# Patient Record
Sex: Female | Born: 1975 | Race: Black or African American | Hispanic: No | Marital: Single | Smoking: Former smoker
Health system: Southern US, Community
[De-identification: ages and names within clinical notes are randomized; demographics above are authoritative.]

## PROBLEM LIST (undated history)

## (undated) DIAGNOSIS — E119 Type 2 diabetes mellitus without complications: Secondary | ICD-10-CM

## (undated) DIAGNOSIS — I10 Essential (primary) hypertension: Secondary | ICD-10-CM

## (undated) HISTORY — PX: BREAST SURGERY: SHX581

---

## 2000-05-25 ENCOUNTER — Emergency Department (HOSPITAL_COMMUNITY): Admission: EM | Admit: 2000-05-25 | Discharge: 2000-05-25 | Payer: Self-pay | Admitting: Emergency Medicine

## 2000-05-25 ENCOUNTER — Encounter: Payer: Self-pay | Admitting: Emergency Medicine

## 2003-04-24 ENCOUNTER — Emergency Department (HOSPITAL_COMMUNITY): Admission: EM | Admit: 2003-04-24 | Discharge: 2003-04-24 | Payer: Self-pay | Admitting: Emergency Medicine

## 2005-07-08 ENCOUNTER — Encounter: Admission: RE | Admit: 2005-07-08 | Discharge: 2005-10-06 | Payer: Self-pay | Admitting: Family Medicine

## 2010-07-09 ENCOUNTER — Emergency Department (HOSPITAL_COMMUNITY): Admission: EM | Admit: 2010-07-09 | Discharge: 2010-07-09 | Payer: Self-pay | Admitting: Emergency Medicine

## 2011-09-12 ENCOUNTER — Other Ambulatory Visit: Payer: Self-pay | Admitting: Family Medicine

## 2011-09-12 ENCOUNTER — Ambulatory Visit
Admission: RE | Admit: 2011-09-12 | Discharge: 2011-09-12 | Disposition: A | Discharge status: Home / Self Care | Payer: 59 | Source: Ambulatory Visit | Attending: Family Medicine | Admitting: Family Medicine

## 2011-09-12 DIAGNOSIS — M545 Low back pain: Secondary | ICD-10-CM

## 2012-11-26 ENCOUNTER — Other Ambulatory Visit: Payer: Self-pay | Admitting: Obstetrics and Gynecology

## 2012-11-26 DIAGNOSIS — N644 Mastodynia: Secondary | ICD-10-CM

## 2012-12-10 ENCOUNTER — Ambulatory Visit
Admission: RE | Admit: 2012-12-10 | Discharge: 2012-12-10 | Disposition: A | Discharge status: Home / Self Care | Payer: Managed Care, Other (non HMO) | Source: Ambulatory Visit | Attending: Obstetrics and Gynecology | Admitting: Obstetrics and Gynecology

## 2012-12-10 ENCOUNTER — Other Ambulatory Visit: Payer: Self-pay | Admitting: Obstetrics and Gynecology

## 2012-12-10 DIAGNOSIS — N644 Mastodynia: Secondary | ICD-10-CM

## 2014-04-20 ENCOUNTER — Emergency Department (INDEPENDENT_AMBULATORY_CARE_PROVIDER_SITE_OTHER)
Admission: EM | Admit: 2014-04-20 | Discharge: 2014-04-20 | Disposition: A | Discharge status: Home / Self Care | Payer: Managed Care, Other (non HMO) | Source: Home / Self Care | Attending: Family Medicine | Admitting: Family Medicine

## 2014-04-20 ENCOUNTER — Encounter (HOSPITAL_COMMUNITY): Payer: Self-pay | Admitting: Emergency Medicine

## 2014-04-20 DIAGNOSIS — Y939 Activity, unspecified: Secondary | ICD-10-CM

## 2014-04-20 DIAGNOSIS — S86819A Strain of other muscle(s) and tendon(s) at lower leg level, unspecified leg, initial encounter: Secondary | ICD-10-CM

## 2014-04-20 DIAGNOSIS — X500XXA Overexertion from strenuous movement or load, initial encounter: Secondary | ICD-10-CM

## 2014-04-20 DIAGNOSIS — S838X9A Sprain of other specified parts of unspecified knee, initial encounter: Secondary | ICD-10-CM

## 2014-04-20 DIAGNOSIS — S86812A Strain of other muscle(s) and tendon(s) at lower leg level, left leg, initial encounter: Secondary | ICD-10-CM

## 2014-04-20 HISTORY — DX: Type 2 diabetes mellitus without complications: E11.9

## 2014-04-20 MED ORDER — DICLOFENAC POTASSIUM 50 MG PO TABS: 50.0000 mg | ORAL_TABLET | Freq: Three times a day (TID) | ORAL | Status: DC

## 2014-04-20 NOTE — ED Notes (Signed)
Pain in left knee since 5-12 when tried to get into car. ?twisted knee

## 2014-04-20 NOTE — Discharge Instructions (Signed)
Ice, ace and medicine as needed, activity as tolerated, let pain be your guide, see orthopedist if further problems

## 2014-04-20 NOTE — ED Provider Notes (Signed)
CSN: [id number]     Arrival date & time 04/20/14  1824 History   First MD Initiated Contact with Patient 04/20/14 1843     Chief Complaint  Patient presents with  . Knee Pain   (Consider location/radiation/quality/duration/timing/severity/associated sxs/prior Treatment) Patient is a 38 y.o. female presenting with knee pain. The history is provided by the patient.  Knee Pain Location:  Knee Time since incident:  2 days Injury: no (onset getting into car and twisted knee with subsequent pain with stairs and getting up from seated position.)   Knee location:  L knee Pain details:    Quality:  Aching   Radiates to:  Does not radiate   Progression:  Unchanged Chronicity:  New Dislocation: no   Prior injury to area:  No   Past Medical History  Diagnosis Date  . Diabetes mellitus without complication    Past Surgical History  Procedure Laterality Date  . Cesarean section    . Breast surgery     History reviewed. No pertinent family history. History  Substance Use Topics  . Smoking status: Never Smoker   . Smokeless tobacco: Not on file  . Alcohol Use: Yes   OB History   Grav Para Term Preterm Abortions TAB SAB Ect Mult Living                 Review of Systems  Constitutional: Negative.   Musculoskeletal: Negative for joint swelling.  Skin: Negative.     Allergies  Review of patient's allergies indicates no known allergies.  Home Medications   Prior to Admission medications   Medication Sig Start Date End Date Taking? Authorizing Provider  insulin detemir (LEVEMIR) 100 UNIT/ML injection Inject 15 Units into the skin at bedtime.   Yes Historical Provider, MD  lisinopril (PRINIVIL,ZESTRIL) 5 MG tablet Take 5 mg by mouth daily.   Yes Historical Provider, MD   BP 171/78  Pulse 85  Temp(Src) 98.1 F (36.7 C) (Oral)  Resp 16  SpO2 97% Physical Exam  Nursing note and vitals reviewed. Constitutional: She is oriented to person, place, and time. She appears  well-developed and well-nourished.  Musculoskeletal: She exhibits tenderness.       Left knee: She exhibits abnormal patellar mobility. She exhibits normal range of motion, no swelling, no effusion, normal alignment, no LCL laxity, normal meniscus and no MCL laxity. Tenderness found. Patellar tendon tenderness noted. No medial joint line and no lateral joint line tenderness noted.       Legs: Neurological: She is alert and oriented to person, place, and time.  Skin: Skin is warm and dry.    ED Course  Procedures (including critical care time) Labs Review Labs Reviewed - No data to display  Imaging Review No results found.   MDM   1. Strain of left patellar tendon        Billy Fischer, MD 04/20/14 [phone]

## 2015-03-19 ENCOUNTER — Encounter: Payer: Self-pay | Admitting: Podiatry

## 2015-03-19 ENCOUNTER — Ambulatory Visit (INDEPENDENT_AMBULATORY_CARE_PROVIDER_SITE_OTHER): Payer: Managed Care, Other (non HMO) | Admitting: Podiatry

## 2015-03-19 VITALS — BP 155/92 | HR 80 | Resp 12

## 2015-03-19 DIAGNOSIS — E119 Type 2 diabetes mellitus without complications: Secondary | ICD-10-CM

## 2015-03-19 DIAGNOSIS — B351 Tinea unguium: Secondary | ICD-10-CM

## 2015-03-19 NOTE — Patient Instructions (Signed)
Diabetes and Foot Care Diabetes may cause you to have problems because of poor blood supply (circulation) to your feet and legs. This may cause the skin on your feet to become thinner, break easier, and heal more slowly. Your skin may become dry, and the skin may peel and crack. You may also have nerve damage in your legs and feet causing decreased feeling in them. You may not notice minor injuries to your feet that could lead to infections or more serious problems. Taking care of your feet is one of the most important things you can do for yourself.  HOME CARE INSTRUCTIONS  Wear shoes at all times, even in the house. Do not go barefoot. Bare feet are easily injured.  Check your feet daily for blisters, cuts, and redness. If you cannot see the bottom of your feet, use a mirror or ask someone for help.  Wash your feet with warm water (do not use hot water) and mild soap. Then pat your feet and the areas between your toes until they are completely dry. Do not soak your feet as this can dry your skin.  Apply a moisturizing lotion or petroleum jelly (that does not contain alcohol and is unscented) to the skin on your feet and to dry, brittle toenails. Do not apply lotion between your toes.  Trim your toenails straight across. Do not dig under them or around the cuticle. File the edges of your nails with an emery board or nail file.  Do not cut corns or calluses or try to remove them with medicine.  Wear clean socks or stockings every day. Make sure they are not too tight. Do not wear knee-high stockings since they may decrease blood flow to your legs.  Wear shoes that fit properly and have enough cushioning. To break in new shoes, wear them for just a few hours a day. This prevents you from injuring your feet. Always look in your shoes before you put them on to be sure there are no objects inside.  Do not cross your legs. This may decrease the blood flow to your feet.  If you find a minor scrape,  cut, or break in the skin on your feet, keep it and the skin around it clean and dry. These areas may be cleansed with mild soap and water. Do not cleanse the area with peroxide, alcohol, or iodine.  When you remove an adhesive bandage, be sure not to damage the skin around it.  If you have a wound, look at it several times a day to make sure it is healing.  Do not use heating pads or hot water bottles. They may burn your skin. If you have lost feeling in your feet or legs, you may not know it is happening until it is too late.  Make sure your health care provider performs a complete foot exam at least annually or more often if you have foot problems. Report any cuts, sores, or bruises to your health care provider immediately. SEEK MEDICAL CARE IF:   You have an injury that is not healing.  You have cuts or breaks in the skin.  You have an ingrown nail.  You notice redness on your legs or feet.  You feel burning or tingling in your legs or feet.  You have pain or cramps in your legs and feet.  Your legs or feet are numb.  Your feet always feel cold. SEEK IMMEDIATE MEDICAL CARE IF:   There is increasing redness,   swelling, or pain in or around a wound.  There is a red line that goes up your leg.  Pus is coming from a wound.  You develop a fever or as directed by your health care provider.  You notice a bad smell coming from an ulcer or wound. Document Released: 11/21/2000 Document Revised: 07/27/2013 Document Reviewed: 05/03/2013 ExitCare Patient Information 2015 [employer]. This information is not intended to replace advice given to you by your health care provider. Make sure you discuss any questions you have with your health care provider.  

## 2015-03-19 NOTE — Progress Notes (Signed)
   Subjective:    Patient ID: Tara Livingston, female    DOB: 07/03/1976, 39 y.o.   MRN: [id number]  HPI  N-THICK NAIL, SORE L-B/L TOENAILS D-1 YEAR O-SLOWLY C-THICKER A-PRESSURE T-NONE  Patient has history of responding to terbinafine by mouth in the past  N-SHARP PAIN L-B/L TOP FEET D-5 MONTHS O-SLOWLY C-WORSE A-PRESSURE, STANDING T-SOAK EPSON SALT Patient describes pain only on first step in the morning and pain resolves throughout the day She describes being more active in the gym and attempt to lose weight    Review of Systems  Constitutional: Positive for activity change.  HENT: Positive for sinus pressure.   Neurological: Positive for headaches.  All other systems reviewed and are negative.  Patient denies any history of foot ulceration or claudication    Objective:   Physical Exam  Orientated 3  Vascular: DP pulses 2/4 bilaterally PT pulses 2/4 bilaterally Capillary reflex immediate bilaterally  Neurological: Sensation to 10 g monofilament wire intact 5/5 bilaterally Vibratory sensation intact bilaterally Ankle reflex equal and reactive bilaterally  Dermatological: The hallux toenails are hypertrophic incurvated and discolored and tender to direct palpation The remaining toenails have occasional texture and color changes  Musculoskeletal: No deformities noted bilaterally There is no restriction ankle, subtalar, midtarsal joints bilaterally There are no areas of palpable tenderness in the right and left feet particularly in the dorsal midfoot bilaterally      Assessment & Plan:   Assessment: Satisfactory neurovascular status Mycotic hallux toenails Midfoot pain may be associated with patient's recent increase in physical activity  Plan: Debrided hallux toenails without a bleeding Return patient 3 weeks for debridement and may offer patient culture if she wishes to consider taking terbinafine again  Discussed patient's gym activity and  encouraged her to minimize ballistic intense activity and substitute swimming, rowing machine, elliptical. Discouraged patient from consistent running  Reappoint 3 months

## 2015-07-02 ENCOUNTER — Ambulatory Visit: Payer: Managed Care, Other (non HMO) | Admitting: Podiatry

## 2016-05-17 ENCOUNTER — Ambulatory Visit (HOSPITAL_COMMUNITY)
Admission: EM | Admit: 2016-05-17 | Discharge: 2016-05-17 | Disposition: A | Discharge status: Home / Self Care | Payer: Managed Care, Other (non HMO) | Attending: Emergency Medicine | Admitting: Emergency Medicine

## 2016-05-17 ENCOUNTER — Ambulatory Visit (INDEPENDENT_AMBULATORY_CARE_PROVIDER_SITE_OTHER): Payer: Managed Care, Other (non HMO)

## 2016-05-17 ENCOUNTER — Encounter (HOSPITAL_COMMUNITY): Payer: Self-pay | Admitting: *Deleted

## 2016-05-17 DIAGNOSIS — S63502A Unspecified sprain of left wrist, initial encounter: Secondary | ICD-10-CM

## 2016-05-17 NOTE — ED Provider Notes (Signed)
CSN: [id number]     Arrival date & time 05/17/16  1827 History   First MD Initiated Contact with Patient 05/17/16 1844     Chief Complaint  Patient presents with  . Fall   (Consider location/radiation/quality/duration/timing/severity/associated sxs/prior Treatment) HPI History obtained from patient: Location: Left wrist  Context/Duration: [place] a few hours ago, while playing kickball  Severity: 3-4  Quality:ache Timing:        constant    Home Treatment: none Associated symptoms:  none Family History:DM, HTN       Past Medical History  Diagnosis Date  . Diabetes mellitus without complication [facility])    Past Surgical History  Procedure Laterality Date  . Cesarean section    . Breast surgery     No family history on file. Social History  Substance Use Topics  . Smoking status: Never Smoker   . Smokeless tobacco: None  . Alcohol Use: Yes   OB History    No data available     Review of Systems  Denies: HEADACHE, NAUSEA, ABDOMINAL PAIN, CHEST PAIN, CONGESTION, DYSURIA, SHORTNESS OF BREATH  Allergies  Review of patient's allergies indicates no known allergies.  Home Medications   Prior to Admission medications   Medication Sig Start Date End Date Taking? Authorizing Provider  empagliflozin (JARDIANCE) 25 MG TABS tablet Take 25 mg by mouth daily.   Yes Historical Provider, MD  insulin detemir (LEVEMIR) 100 UNIT/ML injection Inject 17 Units into the skin at bedtime.    Yes Historical Provider, MD  Liraglutide (VICTOZA [place]) Inject into the skin.   Yes Historical Provider, MD  metformin (FORTAMET) 1000 MG (OSM) 24 hr tablet Take 1,000 mg by mouth 2 (two) times daily. 02/10/15  Yes Historical Provider, MD  lisinopril (PRINIVIL,ZESTRIL) 5 MG tablet Take 5 mg by mouth daily.    Historical Provider, MD  topiramate (TOPAMAX) 100 MG tablet Take 100 mg by mouth 2 (two) times daily.    Historical Provider, MD   Meds Ordered and Administered this Visit  Medications - No data to  display  BP 137/82 mmHg  Pulse 99  Temp(Src) 98.2 F (36.8 C) (Oral)  Resp 18  SpO2 100% No data found.   Physical Exam NURSES NOTES AND VITAL SIGNS REVIEWED. CONSTITUTIONAL: Well developed, well nourished, no acute distress HEENT: normocephalic, atraumatic EYES: Conjunctiva normal NECK:normal ROM, supple, no adenopathy PULMONARY:No respiratory distress, normal effort ABDOMINAL: Soft, ND, NT BS+, No CVAT MUSCULOSKELETAL: Normal ROM of all extremities, Left wrist is without swelling, or visible/palpable deformity.  SKIN: warm and dry without rash PSYCHIATRIC: Mood and affect, behavior are normal  ED Course  Procedures (including critical care time)  Labs Review Labs Reviewed - No data to display  Imaging Review Dg Wrist Complete Left  05/17/2016  CLINICAL DATA:  Fall on outstretched hand during kickball with left wrist pain EXAM: LEFT WRIST - COMPLETE 3+ VIEW COMPARISON:  None. FINDINGS: Mild soft tissue swelling throughout the left wrist. No fracture, dislocation, suspicious focal osseous lesion, appreciable arthropathy or radiopaque foreign body. IMPRESSION: Mild left wrist soft tissue swelling, with no fracture or malalignment. Electronically Signed   By: Ilona Sorrel M.D.   On: 05/17/2016 19:25   Reviewed xray and report used to form MDM.   Visual Acuity Review  Right Eye Distance:   Left Eye Distance:   Bilateral Distance:    Right Eye Near:   Left Eye Near:    Bilateral Near:       Wrist splint applied. Expect  full recovery  MDM   1. Wrist sprain, left, initial encounter     Patient is reassured that there are no issues that require transfer to higher level of care at this time or additional tests. Patient is advised to continue home symptomatic treatment. Patient is advised that if there are new or worsening symptoms to attend the emergency department, contact primary care provider, or return to UC. Instructions of care provided discharged home in stable  condition.    THIS NOTE WAS GENERATED USING A VOICE RECOGNITION SOFTWARE PROGRAM. ALL REASONABLE EFFORTS  WERE MADE TO PROOFREAD THIS DOCUMENT FOR ACCURACY.  I have verbally reviewed the discharge instructions with the patient. A printed AVS was given to the patient.  All questions were answered prior to discharge.      Konrad Felix, [place] 05/17/16 [phone]

## 2016-05-17 NOTE — Discharge Instructions (Signed)
Cryotherapy Cryotherapy is when you put ice on your injury. Ice helps lessen pain and puffiness (swelling) after an injury. Ice works the best when you start using it in the first 24 to 48 hours after an injury. HOME CARE  Put a dry or damp towel between the ice pack and your skin.  You may press gently on the ice pack.  Leave the ice on for no more than 10 to 20 minutes at a time.  Check your skin after 5 minutes to make sure your skin is okay.  Rest at least 20 minutes between ice pack uses.  Stop using ice when your skin loses feeling (numbness).  Do not use ice on someone who cannot tell you when it hurts. This includes small children and people with memory problems (dementia). GET HELP RIGHT AWAY IF:  You have white spots on your skin.  Your skin turns blue or pale.  Your skin feels waxy or hard.  Your puffiness gets worse. MAKE SURE YOU:   Understand these instructions.  Will watch your condition.  Will get help right away if you are not doing well or get worse.   This information is not intended to replace advice given to you by your health care provider. Make sure you discuss any questions you have with your health care provider.   Document Released: 05/12/2008 Document Revised: 02/16/2012 Document Reviewed: 07/17/2011 Elsevier Interactive Patient Education 2016 Elsevier Inc. Wrist Pain There are many things that can cause wrist pain. Some common causes include:  An injury to the wrist area, such as a sprain, strain, or fracture.  Overuse of the joint.  A condition that causes increased pressure on a nerve in the wrist (carpal tunnel syndrome).  Wear and tear of the joints that occurs with aging (osteoarthritis).  A variety of other types of arthritis. Sometimes, the cause of wrist pain is not known. The pain often goes away when you follow your health care provider's instructions for relieving pain at home. If your wrist pain continues, tests may need to be  done to diagnose your condition. HOME CARE INSTRUCTIONS Pay attention to any changes in your symptoms. Take these actions to help with your pain:  Rest the wrist area for at least 48 hours or as told by your health care provider.  If directed, apply ice to the injured area:  Put ice in a plastic bag.  Place a towel between your skin and the bag.  Leave the ice on for 20 minutes, 2-3 times per day.  Keep your arm raised (elevated) above the level of your heart while you are sitting or lying down.  If a splint or elastic bandage has been applied, use it as told by your health care provider.  Remove the splint or bandage only as told by your health care provider.  Loosen the splint or bandage if your fingers become numb or have a tingling feeling, or if they turn cold or blue.  Take over-the-counter and prescription medicines only as told by your health care provider.  Keep all follow-up visits as told by your health care provider. This is important. SEEK MEDICAL CARE IF:  Your pain is not helped by treatment.  Your pain gets worse. SEEK IMMEDIATE MEDICAL CARE IF:  Your fingers become swollen.  Your fingers turn white, very red, or cold and blue.  Your fingers are numb or have a tingling feeling.  You have difficulty moving your fingers.   This information is not  intended to replace advice given to you by your health care provider. Make sure you discuss any questions you have with your health care provider.   Document Released: 09/03/2005 Document Revised: 08/15/2015 Document Reviewed: 04/11/2015 Elsevier Interactive Patient Education [employer].

## 2016-05-17 NOTE — ED Notes (Signed)
Reports tripping and falling while playing kickball today.  C/O left wrist pain.  CMS intact.  Also c/o left forearm pain with palpation.

## 2017-03-07 ENCOUNTER — Ambulatory Visit (INDEPENDENT_AMBULATORY_CARE_PROVIDER_SITE_OTHER): Payer: Managed Care, Other (non HMO) | Admitting: Family Medicine

## 2017-03-07 VITALS — BP 141/83 | HR 79 | Temp 98.0°F | Resp 20 | Ht 65.5 in | Wt 311.5 lb

## 2017-03-07 DIAGNOSIS — E118 Type 2 diabetes mellitus with unspecified complications: Secondary | ICD-10-CM

## 2017-03-07 DIAGNOSIS — G43809 Other migraine, not intractable, without status migrainosus: Secondary | ICD-10-CM | POA: Diagnosis not present

## 2017-03-07 DIAGNOSIS — Z794 Long term (current) use of insulin: Secondary | ICD-10-CM

## 2017-03-07 LAB — POCT URINALYSIS DIP (MANUAL ENTRY)
Bilirubin, UA: NEGATIVE
Blood, UA: NEGATIVE
Ketones, POC UA: NEGATIVE
LEUKOCYTES UA: NEGATIVE
Nitrite, UA: NEGATIVE
Protein Ur, POC: NEGATIVE
Spec Grav, UA: 1.015 (ref 1.030–1.035)
UROBILINOGEN UA: 0.2 (ref ?–2.0)
pH, UA: 5 (ref 5.0–8.0)

## 2017-03-07 LAB — POCT GLYCOSYLATED HEMOGLOBIN (HGB A1C): Hemoglobin A1C: 9.2

## 2017-03-07 MED ORDER — ASPIRIN EC 81 MG PO TBEC: 81.0000 mg | DELAYED_RELEASE_TABLET | ORAL | Status: AC

## 2017-03-07 MED ORDER — METFORMIN HCL ER (OSM) 1000 MG PO TB24: 2000.0000 mg | ORAL_TABLET | Freq: Every day | ORAL | 1 refills | Status: DC

## 2017-03-07 MED ORDER — LIRAGLUTIDE 18 MG/3ML ~~LOC~~ SOPN: 1.8000 mg | PEN_INJECTOR | SUBCUTANEOUS | 1 refills | Status: DC

## 2017-03-07 MED ORDER — EMPAGLIFLOZIN 25 MG PO TABS: 25.0000 mg | ORAL_TABLET | Freq: Every day | ORAL | 1 refills | Status: DC

## 2017-03-07 MED ORDER — INSULIN DETEMIR 100 UNIT/ML ~~LOC~~ SOLN: 17.0000 [IU] | Freq: Every day | SUBCUTANEOUS | 1 refills | Status: DC

## 2017-03-07 NOTE — Progress Notes (Signed)
Subjective:  By signing my name below, I, Moises Blood, attest that this documentation has been prepared under the direction and in the presence of Delman Cheadle, MD. Electronically Signed: Moises Blood, [place]. 03/07/2017 , 10:58 AM .  Patient was seen in Room 1 .   Patient ID: Tara Livingston, female    DOB: 01-19-1976, 41 y.o.   MRN: [id number] Chief Complaint  Patient presents with  . Establish Care  . Diabetes  . Medication Refill   HPI Tara Livingston is a 41 y.o. female who has history of diabetes presents to Primary Care at [facility] to establish care, and medication refill. She was previously followed by Lucianne Lei, MD. She requests her labs be sent to [facility] or [facility].   Patient states her last A1C was checked by [facility] in Oct, at 6.1. She denies any hypoglycemic episodes. She hasn't been doing well on her diet regime because she's been taking care of her son, who was diagnosed with bipolar disorder. She is on Victoza injection in the morning, levermir 17 units at night, and Metformin 1026m BID. She's been on the same regime since 2016, but she did add Jardiance last year. She hasn't been checking her sugars at home, and plans to buy a new meter.   She hasn't been taking lisinopril, because it cramped up her legs. She hasn't been taking topamax since she had a piercing placed in her right ear in July 2016. Her migraines have decreased. [place] Her psychologist is [employer]   Past Medical History:  Diagnosis Date  . [place] mellitus without complication [place]    Prior to Admission medications   Medication Sig Start Date End Date Taking? Authorizing Provider  empagliflozin (JARDIANCE) 25 MG TABS tablet Take 25 mg by mouth daily.   Yes Historical Provider, MD  insulin detemir (LEVEMIR) 100 UNIT/ML injection Inject 17 Units into the skin at bedtime.    Yes Historical Provider, MD  Liraglutide (VICTOZA [place]) Inject into the skin.   Yes  Historical Provider, MD  metformin (FORTAMET) 1000 MG (OSM) 24 hr tablet Take 1,000 mg by mouth 2 (two) times daily. 02/10/15  Yes Historical Provider, MD  lisinopril (PRINIVIL,ZESTRIL) 5 MG tablet Take 5 mg by mouth daily.    Historical Provider, MD  topiramate (TOPAMAX) 100 MG tablet Take 100 mg by mouth 2 (two) times daily.    Historical Provider, MD   No Known Allergies   Past Surgical History:  Procedure Laterality Date  . BREAST SURGERY    . CESAREAN SECTION     No family history on file. Social History   Social History  . Marital status: Single    Spouse name: N/A  . Number of children: N/A  . Years of education: N/A   Social History Main Topics  . Smoking status: Former SResearch scientist (life sciences) . Smokeless tobacco: Never Used  . Alcohol use Yes  . Drug use: No  . Sexual activity: Yes    Birth control/ protection: IUD   Other Topics Concern  . Not on file   Social History Narrative  . No narrative on file   [facility]  Decreased Interest 0  Down, Depressed, Hopeless 0  PHQ - 2 Score 0     Review of Systems  Constitutional: Negative for fatigue and unexpected weight change.  Respiratory: Negative for chest tightness and shortness of breath.   Cardiovascular: Negative for chest pain, palpitations and leg swelling.  Gastrointestinal: Negative for abdominal pain and blood in stool.  Neurological: Negative for dizziness, syncope, light-headedness and headaches.       Objective:   Physical Exam  Constitutional: She is oriented to person, place, and time. She appears well-developed and well-nourished. No distress.  HENT:  Head: Normocephalic and atraumatic.  Eyes: EOM are normal. Pupils are equal, round, and reactive to light.  Neck: Neck supple.  Cardiovascular: Normal rate.   Pulmonary/Chest: Effort normal. No respiratory distress.  Musculoskeletal: Normal range of motion.  Neurological: She is alert and oriented to person, place, and time.  Skin:  Skin is warm and dry.  Psychiatric: She has a normal mood and affect. Her behavior is normal.  Nursing note and vitals reviewed.   BP (!) 141/83   Pulse 79   Temp 98 F (36.7 C) (Oral)   Resp 20   Ht 5' 5.5" (1.664 m)   Wt (!) 311 lb 8 oz (141.3 kg)   SpO2 99%   BMI 51.05 kg/m    Results for orders placed or performed in visit on 03/07/17  POCT glycosylated hemoglobin (Hb A1C)  Result Value Ref Range   Hemoglobin A1C 9.2   POCT urinalysis dipstick  Result Value Ref Range   Color, UA yellow yellow   Clarity, UA clear clear   Glucose, UA >=1,000 (A) negative   Bilirubin, UA negative negative   Ketones, POC UA negative negative   Spec Grav, UA 1.015 1.030 - 1.035   Blood, UA negative negative   pH, UA 5.0 5.0 - 8.0   Protein Ur, POC negative negative   Urobilinogen, UA 0.2 Negative - 2.0   Nitrite, UA Negative Negative   Leukocytes, UA Negative Negative       Assessment & Plan:   1. Type 2 diabetes mellitus with complication, with long-term current use of insulin (HCC)   2. Other migraine without status migrainosus, not intractable     Orders Placed This Encounter  Procedures  . Lipid panel  . COMPLETE METABOLIC PANEL WITH GFR  . Microalbumin / creatinine urine ratio  . POCT glycosylated hemoglobin (Hb A1C)  . POCT urinalysis dipstick    Meds ordered this encounter  Medications  . aspirin EC 81 MG tablet    Sig: Take 1 tablet (81 mg total) by mouth every other day.  . metformin (FORTAMET) 1000 MG (OSM) 24 hr tablet    Sig: Take 2 tablets (2,000 mg total) by mouth daily with breakfast.    Dispense:  180 tablet    Refill:  1  . DISCONTD: insulin detemir (LEVEMIR) 100 UNIT/ML injection    Sig: Inject 0.17 mLs (17 Units total) into the skin at bedtime.    Dispense:  20 mL    Refill:  1  . empagliflozin (JARDIANCE) 25 MG TABS tablet    Sig: Take 25 mg by mouth daily.    Dispense:  90 tablet    Refill:  1  . liraglutide 18 MG/3ML SOPN    Sig: Inject 0.3 mLs  (1.8 mg total) into the skin every morning.    Dispense:  9 pen    Refill:  1  . Insulin Detemir (LEVEMIR FLEXPEN) 100 UNIT/ML Pen    Sig: Inject 17 Units into the skin daily at 10 pm.    Dispense:  15 mL    Refill:  0  . Insulin Syringes, Disposable, U-100 1 ML MISC    Sig: Use daily with insulin levimir 17 units  Dispense:  199 each    Refill:  0    I personally performed the services described in this documentation, which was scribed in my presence. The recorded information has been reviewed and considered, and addended by me as needed.   Delman Cheadle, M.D.  Primary Care at [facility] [address] [place], [place] [address] [phone] phone [phone] fax  04/05/17 9:28 PM

## 2017-03-07 NOTE — Patient Instructions (Addendum)
IF you received an x-ray today, you will receive an invoice from [facility]. Please contact [facility] at [phone] with questions or concerns regarding your invoice.   IF you received labwork today, you will receive an invoice from [facility]. Please contact [facility] at [phone] with questions or concerns regarding your invoice.   Our billing staff will not be able to assist you with questions regarding bills from these companies.  You will be contacted with the lab results as soon as they are available. The fastest way to get your results is to activate your My Chart account. Instructions are located on the last page of this paperwork. If you have not heard from [place] regarding the results in 2 weeks, please contact this office.    I suggest you start watching your diet or I am going to increase your insulin which can be associated with weight gain.  Most insurances will pay for a visit with the nutritionist at no cost to you as of course most of [place] aren't aware of some of the choices that were making that are causing this significant problems and perhaps were not even really enjoying that very much.  Diabetes Mellitus and Food It is important for you to manage your blood sugar (glucose) level. Your blood glucose level can be greatly affected by what you eat. Eating healthier foods in the appropriate amounts throughout the day at about the same time each day will help you control your blood glucose level. It can also help slow or prevent worsening of your diabetes mellitus. Healthy eating may even help you improve the level of your blood pressure and reach or maintain a healthy weight. General recommendations for healthful eating and cooking habits include:  Eating meals and snacks regularly. Avoid going long periods of time without eating to lose weight.  Eating a diet that consists mainly of plant-based foods, such as fruits, vegetables, nuts, legumes, and whole  grains.  Using low-heat cooking methods, such as baking, instead of high-heat cooking methods, such as deep frying. Work with your dietitian to make sure you understand how to use the Nutrition Facts information on food labels. How can food affect me? Carbohydrates  Carbohydrates affect your blood glucose level more than any other type of food. Your dietitian will help you determine how many carbohydrates to eat at each meal and teach you how to count carbohydrates. Counting carbohydrates is important to keep your blood glucose at a healthy level, especially if you are using insulin or taking certain medicines for diabetes mellitus. Alcohol  Alcohol can cause sudden decreases in blood glucose (hypoglycemia), especially if you use insulin or take certain medicines for diabetes mellitus. Hypoglycemia can be a life-threatening condition. Symptoms of hypoglycemia (sleepiness, dizziness, and disorientation) are similar to symptoms of having too much alcohol. If your health care provider has given you approval to drink alcohol, do so in moderation and use the following guidelines:  Women should not have more than one drink per day, and men should not have more than two drinks per day. One drink is equal to:  12 oz of beer.  5 oz of wine.  1 oz of hard liquor.  Do not drink on an empty stomach.  Keep yourself hydrated. Have water, diet soda, or unsweetened iced tea.  Regular soda, juice, and other mixers might contain a lot of carbohydrates and should be counted. What foods are not recommended? As you make food choices, it is important to remember  that all foods are not the same. Some foods have fewer nutrients per serving than other foods, even though they might have the same number of calories or carbohydrates. It is difficult to get your body what it needs when you eat foods with fewer nutrients. Examples of foods that you should avoid that are high in calories and carbohydrates but low in  nutrients include:  Trans fats (most processed foods list trans fats on the Nutrition Facts label).  Regular soda.  Juice.  Candy.  Sweets, such as cake, pie, doughnuts, and cookies.  Fried foods. What foods can I eat? Eat nutrient-rich foods, which will nourish your body and keep you healthy. The food you should eat also will depend on several factors, including:  The calories you need.  The medicines you take.  Your weight.  Your blood glucose level.  Your blood pressure level.  Your cholesterol level. You should eat a variety of foods, including:  Protein.  Lean cuts of meat.  Proteins low in saturated fats, such as fish, egg whites, and beans. Avoid processed meats.  Fruits and vegetables.  Fruits and vegetables that may help control blood glucose levels, such as apples, mangoes, and yams.  Dairy products.  Choose fat-free or low-fat dairy products, such as milk, yogurt, and cheese.  Grains, bread, pasta, and rice.  Choose whole grain products, such as multigrain bread, whole oats, and brown rice. These foods may help control blood pressure.  Fats.  Foods containing healthful fats, such as nuts, avocado, olive oil, canola oil, and fish. Does everyone with diabetes mellitus have the same meal plan? Because every person with diabetes mellitus is different, there is not one meal plan that works for everyone. It is very important that you meet with a dietitian who will help you create a meal plan that is just right for you. This information is not intended to replace advice given to you by your health care provider. Make sure you discuss any questions you have with your health care provider. Document Released: 08/21/2005 Document Revised: 05/01/2016 Document Reviewed: 10/21/2013 Elsevier Interactive Patient Education  2017 [employer].

## 2017-03-08 LAB — LIPID PANEL
Cholesterol: 105 mg/dL (ref <200)
HDL: 36 mg/dL — ABNORMAL LOW (ref >50)
LDL Cholesterol: 59 mg/dL (ref <100)
TRIGLYCERIDES: 50 mg/dL (ref <150)
Total CHOL/HDL Ratio: 2.9 Ratio (ref <5.0)
VLDL: 10 mg/dL (ref <30)

## 2017-03-08 LAB — MICROALBUMIN / CREATININE URINE RATIO
CREATININE, URINE: 110 mg/dL (ref 20–320)
Microalb, Ur: <0.2 mg/dL

## 2017-03-08 LAB — COMPLETE METABOLIC PANEL WITH GFR
ALT: 19 U/L (ref 6–29)
AST: 16 U/L (ref 10–30)
Albumin: 4 g/dL (ref 3.6–5.1)
Alkaline Phosphatase: 55 U/L (ref 33–115)
BUN: 10 mg/dL (ref 7–25)
CO2: 21 mmol/L (ref 20–31)
CREATININE: 0.92 mg/dL (ref 0.50–1.10)
Calcium: 9.6 mg/dL (ref 8.6–10.2)
Chloride: 107 mmol/L (ref 98–110)
GFR, Est Non African American: 78 mL/min (ref >=60)
Glucose, Bld: 132 mg/dL — ABNORMAL HIGH (ref 65–99)
Potassium: 4.3 mmol/L (ref 3.5–5.3)
Sodium: 140 mmol/L (ref 135–146)
TOTAL PROTEIN: 7.4 g/dL (ref 6.1–8.1)
Total Bilirubin: 0.5 mg/dL (ref 0.2–1.2)

## 2017-03-09 MED ORDER — INSULIN DETEMIR 100 UNIT/ML FLEXPEN: 17.0000 [IU] | PEN_INJECTOR | Freq: Every day | SUBCUTANEOUS | 0 refills | Status: DC

## 2017-03-09 MED ORDER — INSULIN SYRINGES (DISPOSABLE) U-100 1 ML MISC: 0 refills | Status: DC

## 2017-04-05 ENCOUNTER — Other Ambulatory Visit: Payer: Self-pay | Admitting: Family Medicine

## 2017-04-30 IMAGING — DX DG WRIST COMPLETE 3+V*L*
4 series · 4 of 4 positions shown · non-contrast
Comparison: None.

CLINICAL DATA: Fall on outstretched hand during kickball with left
wrist pain

EXAM:
LEFT WRIST - COMPLETE 3+ VIEW

[wrist pa]
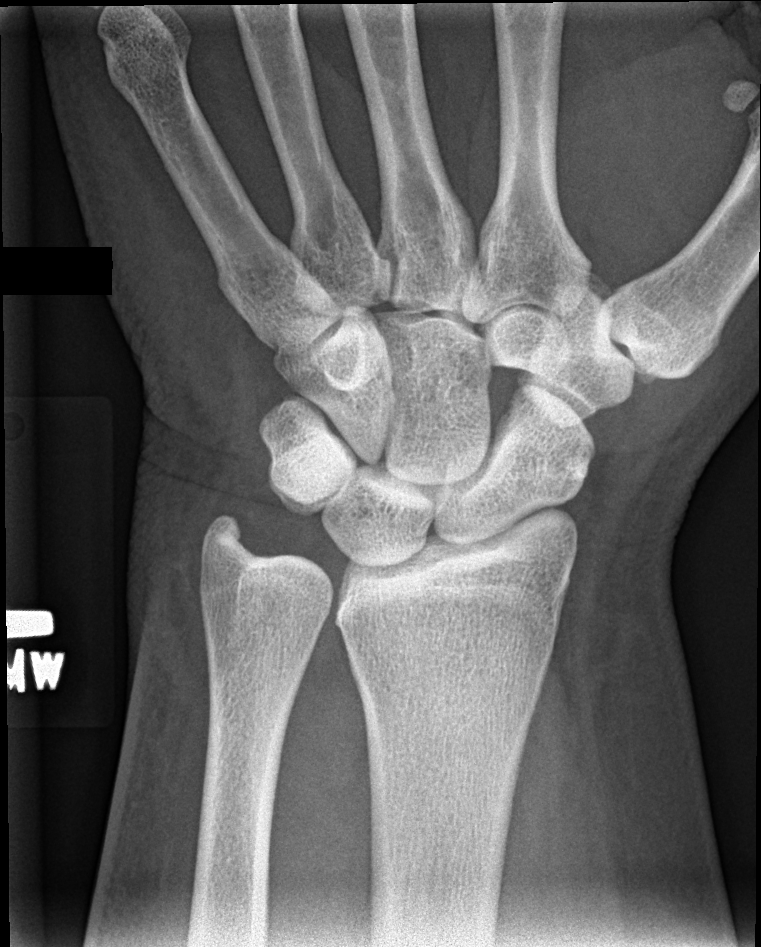

[wrist navicular]
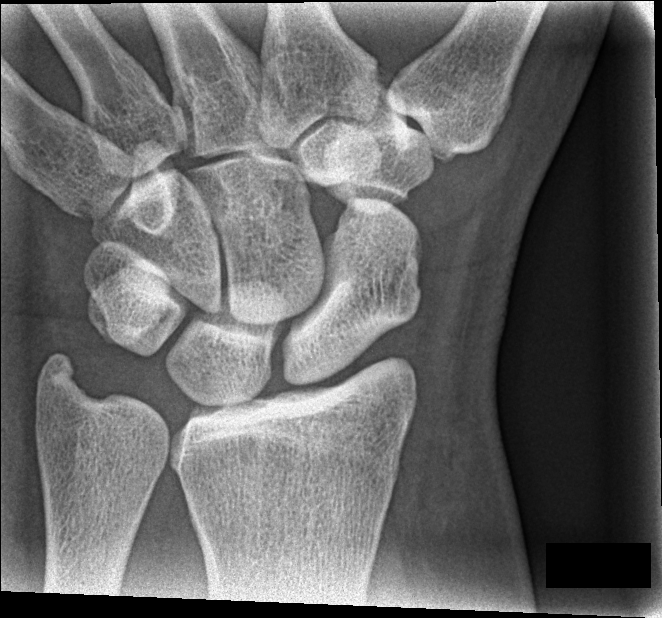

[wrist obl]
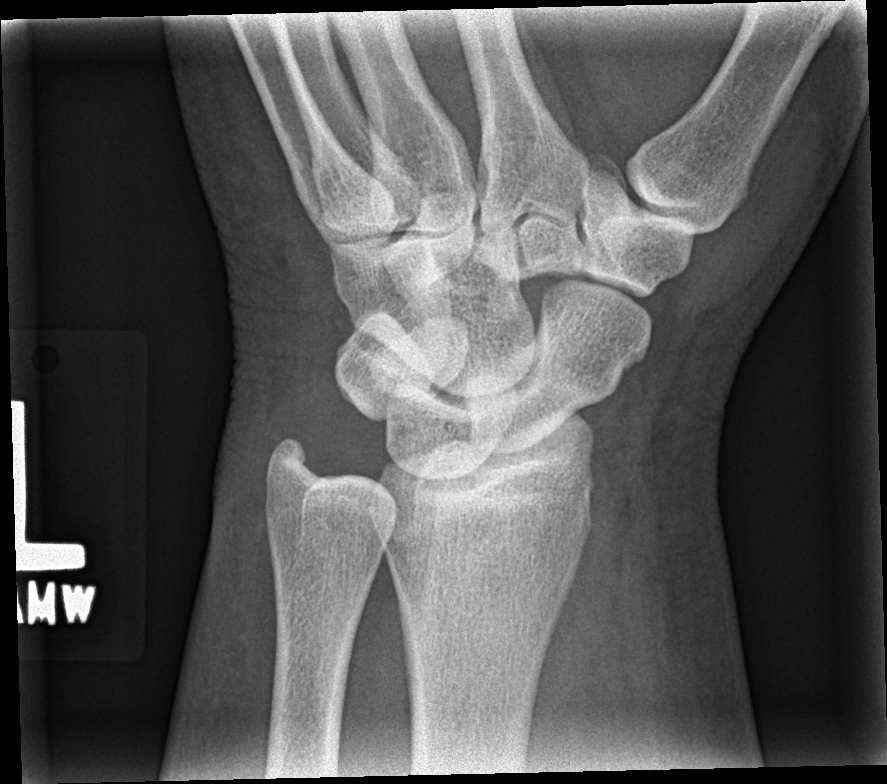

[wrist lat]
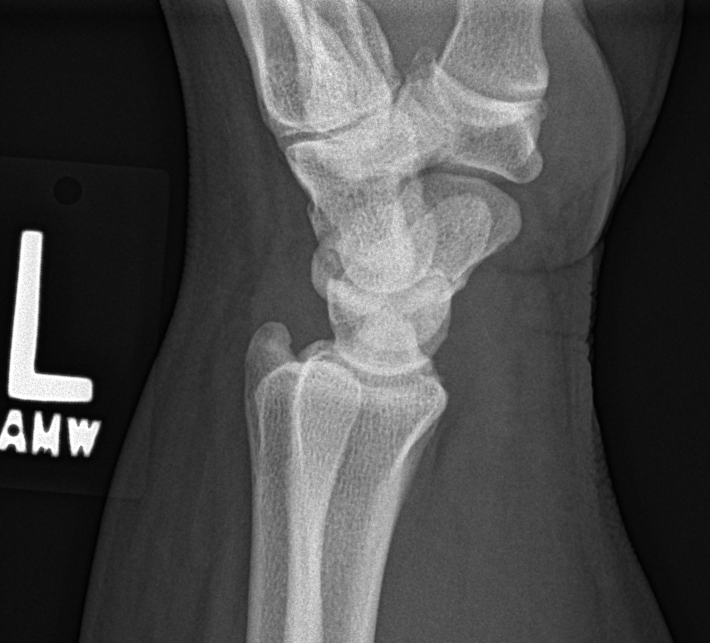

[4 of 4 positions shown; findings below may reference images not displayed]

FINDINGS: Mild soft tissue swelling throughout the left wrist. No fracture,
dislocation, suspicious focal osseous lesion, appreciable
arthropathy or radiopaque foreign body.
IMPRESSION: Mild left wrist soft tissue swelling, with no fracture or
malalignment.

## 2017-05-08 ENCOUNTER — Other Ambulatory Visit: Payer: Self-pay | Admitting: Family Medicine

## 2017-08-18 ENCOUNTER — Ambulatory Visit (INDEPENDENT_AMBULATORY_CARE_PROVIDER_SITE_OTHER): Payer: 59 | Admitting: Physician Assistant

## 2017-08-18 ENCOUNTER — Encounter: Payer: Self-pay | Admitting: Physician Assistant

## 2017-08-18 VITALS — BP 165/99 | HR 76 | Temp 97.8°F | Resp 18 | Ht 65.5 in | Wt 326.4 lb

## 2017-08-18 DIAGNOSIS — Z794 Long term (current) use of insulin: Secondary | ICD-10-CM | POA: Diagnosis not present

## 2017-08-18 DIAGNOSIS — I10 Essential (primary) hypertension: Secondary | ICD-10-CM | POA: Diagnosis not present

## 2017-08-18 DIAGNOSIS — E118 Type 2 diabetes mellitus with unspecified complications: Secondary | ICD-10-CM

## 2017-08-18 LAB — POCT URINALYSIS DIP (MANUAL ENTRY)
Bilirubin, UA: NEGATIVE
Blood, UA: NEGATIVE
Ketones, POC UA: NEGATIVE mg/dL
Leukocytes, UA: NEGATIVE
Nitrite, UA: NEGATIVE
PROTEIN UA: NEGATIVE mg/dL
Spec Grav, UA: 1.015 (ref 1.010–1.025)
UROBILINOGEN UA: 0.2 U/dL
pH, UA: 5.5 (ref 5.0–8.0)

## 2017-08-18 MED ORDER — AMLODIPINE BESYLATE 5 MG PO TABS: 5.0000 mg | ORAL_TABLET | Freq: Every day | ORAL | 1 refills | Status: DC

## 2017-08-18 MED ORDER — HYDROCHLOROTHIAZIDE 12.5 MG PO CAPS: 12.5000 mg | ORAL_CAPSULE | Freq: Every day | ORAL | 1 refills | Status: DC

## 2017-08-18 NOTE — Patient Instructions (Addendum)
In terms of elevated blood pressure, I would like you to start taking both amlodipine and hctz daily. Please continue to check your blood pressure at least a couple times over the next week outside of the office and document these values. It is best if you check the blood pressure at different times in the day. Your goal is <140/90. If you start to have chest pain, blurred vision, shortness of breath, severe headache, lower leg swelling, or nausea/vomiting please seek care immediately here or at the ED. Plan to follow up in 1-2 weeks for repeat blood pressure. We should have your lab results in 1 week and will contact you with these results. I have also placed referral for diabetic nutrition and they should contact you with appointment details in 1-2 weeks. I highly encourage you follow up with them. I also encourage you to take advantage of your gym membership and start going! Any exercise is better than no exercise! Below is some information about hypertension. Thank you for letting me participate in your health and well being.  Managing Your Hypertension Hypertension is commonly called high blood pressure. This is when the force of your blood pressing against the walls of your arteries is too strong. Arteries are blood vessels that carry blood from your heart throughout your body. Hypertension forces the heart to work harder to pump blood, and may cause the arteries to become narrow or stiff. Having untreated or uncontrolled hypertension can cause heart attack, stroke, kidney disease, and other problems. What are blood pressure readings? A blood pressure reading consists of a higher number over a lower number. Ideally, your blood pressure should be below 120/80. The first ("top") number is called the systolic pressure. It is a measure of the pressure in your arteries as your heart beats. The second ("bottom") number is called the diastolic pressure. It is a measure of the pressure in your arteries as the  heart relaxes. What does my blood pressure reading mean? Blood pressure is classified into four stages. Based on your blood pressure reading, your health care provider may use the following stages to determine what type of treatment you need, if any. Systolic pressure and diastolic pressure are measured in a unit called mm Hg. Normal  Systolic pressure: below 456.  Diastolic pressure: below 80. Elevated  Systolic pressure: 256-389.  Diastolic pressure: below 80. Hypertension stage 1  Systolic pressure: 373-428.  Diastolic pressure: 76-81. Hypertension stage 2  Systolic pressure: 157 or above.  Diastolic pressure: 90 or above. What health risks are associated with hypertension? Managing your hypertension is an important responsibility. Uncontrolled hypertension can lead to:  A heart attack.  A stroke.  A weakened blood vessel (aneurysm).  Heart failure.  Kidney damage.  Eye damage.  Metabolic syndrome.  Memory and concentration problems.  What changes can I make to manage my hypertension? Hypertension can be managed by making lifestyle changes and possibly by taking medicines. Your health care provider will help you make a plan to bring your blood pressure within a normal range. Eating and drinking  Eat a diet that is high in fiber and potassium, and low in salt (sodium), added sugar, and fat. An example eating plan is called the DASH (Dietary Approaches to Stop Hypertension) diet. To eat this way: ? Eat plenty of fresh fruits and vegetables. Try to fill half of your plate at each meal with fruits and vegetables. ? Eat whole grains, such as whole wheat pasta, brown rice, or whole grain bread.  Fill about one quarter of your plate with whole grains. ? Eat low-fat diary products. ? Avoid fatty cuts of meat, processed or cured meats, and poultry with skin. Fill about one quarter of your plate with lean proteins such as fish, chicken without skin, beans, eggs, and  tofu. ? Avoid premade and processed foods. These tend to be higher in sodium, added sugar, and fat.  Reduce your daily sodium intake. Most people with hypertension should eat less than 1,500 mg of sodium a day.  Limit alcohol intake to no more than 1 drink a day for nonpregnant women and 2 drinks a day for men. One drink equals 12 oz of beer, 5 oz of wine, or 1 oz of hard liquor. Lifestyle  Work with your health care provider to maintain a healthy body weight, or to lose weight. Ask what an ideal weight is for you.  Get at least 30 minutes of exercise that causes your heart to beat faster (aerobic exercise) most days of the week. Activities may include walking, swimming, or biking.  Include exercise to strengthen your muscles (resistance exercise), such as weight lifting, as part of your weekly exercise routine. Try to do these types of exercises for 30 minutes at least 3 days a week.  Do not use any products that contain nicotine or tobacco, such as cigarettes and e-cigarettes. If you need help quitting, ask your health care provider.  Control any long-term (chronic) conditions you have, such as high cholesterol or diabetes. Monitoring  Monitor your blood pressure at home as told by your health care provider. Your personal target blood pressure may vary depending on your medical conditions, your age, and other factors.  Have your blood pressure checked regularly, as often as told by your health care provider. Working with your health care provider  Review all the medicines you take with your health care provider because there may be side effects or interactions.  Talk with your health care provider about your diet, exercise habits, and other lifestyle factors that may be contributing to hypertension.  Visit your health care provider regularly. Your health care provider can help you create and adjust your plan for managing hypertension. Will I need medicine to control my blood  pressure? Your health care provider may prescribe medicine if lifestyle changes are not enough to get your blood pressure under control, and if:  Your systolic blood pressure is 130 or higher.  Your diastolic blood pressure is 80 or higher.  Take medicines only as told by your health care provider. Follow the directions carefully. Blood pressure medicines must be taken as prescribed. The medicine does not work as well when you skip doses. Skipping doses also puts you at risk for problems. Contact a health care provider if:  You think you are having a reaction to medicines you have taken.  You have repeated (recurrent) headaches.  You feel dizzy.  You have swelling in your ankles.  You have trouble with your vision. Get help right away if:  You develop a severe headache or confusion.  You have unusual weakness or numbness, or you feel faint.  You have severe pain in your chest or abdomen.  You vomit repeatedly.  You have trouble breathing. Summary  Hypertension is when the force of blood pumping through your arteries is too strong. If this condition is not controlled, it may put you at risk for serious complications.  Your personal target blood pressure may vary depending on your medical conditions, your age,  and other factors. For most people, a normal blood pressure is less than 120/80.  Hypertension is managed by lifestyle changes, medicines, or both. Lifestyle changes include weight loss, eating a healthy, low-sodium diet, exercising more, and limiting alcohol. This information is not intended to replace advice given to you by your health care provider. Make sure you discuss any questions you have with your health care provider. Document Released: 08/18/2012 Document Revised: 10/22/2016 Document Reviewed: 10/22/2016 Elsevier Interactive Patient Education  2018 [place] Eating Plan DASH stands for "Dietary Approaches to Stop Hypertension." The DASH eating plan  is a healthy eating plan that has been shown to reduce high blood pressure (hypertension). It may also reduce your risk for type 2 diabetes, heart disease, and stroke. The DASH eating plan may also help with weight loss. What are tips for following this plan? General guidelines  Avoid eating more than 2,300 mg (milligrams) of salt (sodium) a day. If you have hypertension, you may need to reduce your sodium intake to 1,500 mg a day.  Limit alcohol intake to no more than 1 drink a day for nonpregnant women and 2 drinks a day for men. One drink equals 12 oz of beer, 5 oz of wine, or 1 oz of hard liquor.  Work with your health care provider to maintain a healthy body weight or to lose weight. Ask what an ideal weight is for you.  Get at least 30 minutes of exercise that causes your heart to beat faster (aerobic exercise) most days of the week. Activities may include walking, swimming, or biking.  Work with your health care provider or diet and nutrition specialist (dietitian) to adjust your eating plan to your individual calorie needs. Reading food labels  Check food labels for the amount of sodium per serving. Choose foods with less than 5 percent of the Daily Value of sodium. Generally, foods with less than 300 mg of sodium per serving fit into this eating plan.  To find whole grains, look for the word "whole" as the first word in the ingredient list. Shopping  Buy products labeled as "low-sodium" or "no salt added."  Buy fresh foods. Avoid canned foods and premade or frozen meals. Cooking  Avoid adding salt when cooking. Use salt-free seasonings or herbs instead of table salt or sea salt. Check with your health care provider or pharmacist before using salt substitutes.  Do not fry foods. Cook foods using healthy methods such as baking, boiling, grilling, and broiling instead.  Cook with heart-healthy oils, such as olive, canola, soybean, or sunflower oil. Meal planning   Eat a  balanced diet that includes: ? 5 or more servings of fruits and vegetables each day. At each meal, try to fill half of your plate with fruits and vegetables. ? Up to 6-8 servings of whole grains each day. ? Less than 6 oz of lean meat, poultry, or fish each day. A 3-oz serving of meat is about the same size as a deck of cards. One egg equals 1 oz. ? 2 servings of low-fat dairy each day. ? A serving of nuts, seeds, or beans 5 times each week. ? Heart-healthy fats. Healthy fats called Omega-3 fatty acids are found in foods such as flaxseeds and coldwater fish, like sardines, salmon, and mackerel.  Limit how much you eat of the following: ? Canned or prepackaged foods. ? Food that is high in trans fat, such as fried foods. ? Food that is high in saturated fat, such  as fatty meat. ? Sweets, desserts, sugary drinks, and other foods with added sugar. ? Full-fat dairy products.  Do not salt foods before eating.  Try to eat at least 2 vegetarian meals each week.  Eat more home-cooked food and less restaurant, buffet, and fast food.  When eating at a restaurant, ask that your food be prepared with less salt or no salt, if possible. What foods are recommended? The items listed may not be a complete list. Talk with your dietitian about what dietary choices are best for you. Grains Whole-grain or whole-wheat bread. Whole-grain or whole-wheat pasta. Brown rice. [place]. Bulgur. Whole-grain and low-sodium cereals. Pita bread. Low-fat, low-sodium crackers. Whole-wheat flour tortillas. Vegetables Fresh or frozen vegetables (raw, steamed, roasted, or grilled). Low-sodium or reduced-sodium tomato and vegetable juice. Low-sodium or reduced-sodium tomato sauce and tomato paste. Low-sodium or reduced-sodium canned vegetables. Fruits All fresh, dried, or frozen fruit. Canned fruit in natural juice (without added sugar). Meat and other protein foods Skinless chicken or [place]. Ground chicken or  [place]. Pork with fat trimmed off. Fish and seafood. Egg whites. Dried beans, peas, or lentils. Unsalted nuts, nut butters, and seeds. Unsalted canned beans. Lean cuts of beef with fat trimmed off. Low-sodium, lean deli meat. Dairy Low-fat (1%) or fat-free (skim) milk. Fat-free, low-fat, or reduced-fat cheeses. Nonfat, low-sodium ricotta or cottage cheese. Low-fat or nonfat yogurt. Low-fat, low-sodium cheese. Fats and oils Soft margarine without trans fats. Vegetable oil. Low-fat, reduced-fat, or light mayonnaise and salad dressings (reduced-sodium). Canola, safflower, olive, soybean, and sunflower oils. Avocado. Seasoning and other foods Herbs. Spices. Seasoning mixes without salt. Unsalted popcorn and pretzels. Fat-free sweets. What foods are not recommended? The items listed may not be a complete list. Talk with your dietitian about what dietary choices are best for you. Grains Baked goods made with fat, such as croissants, muffins, or some breads. Dry pasta or rice meal packs. Vegetables Creamed or fried vegetables. Vegetables in a cheese sauce. Regular canned vegetables (not low-sodium or reduced-sodium). Regular canned tomato sauce and paste (not low-sodium or reduced-sodium). Regular tomato and vegetable juice (not low-sodium or reduced-sodium). Angie Fava. Olives. Fruits Canned fruit in a light or heavy syrup. Fried fruit. Fruit in cream or butter sauce. Meat and other protein foods Fatty cuts of meat. Ribs. Fried meat. Berniece Salines. Sausage. Bologna and other processed lunch meats. Salami. Fatback. Hotdogs. Bratwurst. Salted nuts and seeds. Canned beans with added salt. Canned or smoked fish. Whole eggs or egg yolks. Chicken or [place] with skin. Dairy Whole or 2% milk, cream, and half-and-half. Whole or full-fat cream cheese. Whole-fat or sweetened yogurt. Full-fat cheese. Nondairy creamers. Whipped toppings. Processed cheese and cheese spreads. Fats and oils Butter. Stick margarine. Lard.  Shortening. Ghee. Bacon fat. Tropical oils, such as coconut, palm kernel, or palm oil. Seasoning and other foods Salted popcorn and pretzels. Onion salt, garlic salt, seasoned salt, table salt, and sea salt. Worcestershire sauce. Tartar sauce. Barbecue sauce. Teriyaki sauce. Soy sauce, including reduced-sodium. Steak sauce. Canned and packaged gravies. Fish sauce. Oyster sauce. Cocktail sauce. Horseradish that you find on the shelf. Ketchup. Mustard. Meat flavorings and tenderizers. Bouillon cubes. Hot sauce and Tabasco sauce. Premade or packaged marinades. Premade or packaged taco seasonings. Relishes. Regular salad dressings. Where to find more information:  National Heart, Lung, and [place]: https://wilson-eaton.com/  American Heart Association: www.heart.org Summary  The DASH eating plan is a healthy eating plan that has been shown to reduce high blood pressure (hypertension). It may also reduce your risk  for type 2 diabetes, heart disease, and stroke.  With the DASH eating plan, you should limit salt (sodium) intake to 2,300 mg a day. If you have hypertension, you may need to reduce your sodium intake to 1,500 mg a day.  When on the DASH eating plan, aim to eat more fresh fruits and vegetables, whole grains, lean proteins, low-fat dairy, and heart-healthy fats.  Work with your health care provider or diet and nutrition specialist (dietitian) to adjust your eating plan to your individual calorie needs. This information is not intended to replace advice given to you by your health care provider. Make sure you discuss any questions you have with your health care provider. Document Released: 11/13/2011 Document Revised: 11/17/2016 Document Reviewed: 11/17/2016 Elsevier Interactive Patient Education  2017 [employer].  How to Take Your Blood Pressure You can take your blood pressure at home with a machine. You may need to check your blood pressure at home:  To check if you have high blood  pressure (hypertension).  To check your blood pressure over time.  To make sure your blood pressure medicine is working.  Supplies needed: You will need a blood pressure machine, or monitor. You can buy one at a drugstore or online. When choosing one:  Choose one with an arm cuff.  Choose one that wraps around your upper arm. Only one finger should fit between your arm and the cuff.  Do not choose one that measures your blood pressure from your wrist or finger.  Your doctor can suggest a monitor. How to prepare Avoid these things for 30 minutes before checking your blood pressure:  Drinking caffeine.  Drinking alcohol.  Eating.  Smoking.  Exercising.  Five minutes before checking your blood pressure:  Pee.  Sit in a dining chair. Avoid sitting in a soft couch or armchair.  Be quiet. Do not talk.  How to take your blood pressure Follow the instructions that came with your machine. If you have a digital blood pressure monitor, these may be the instructions: 1. Sit up straight. 2. Place your feet on the floor. Do not cross your ankles or legs. 3. Rest your left arm at the level of your heart. You may rest it on a table, desk, or chair. 4. Pull up your shirt sleeve. 5. Wrap the blood pressure cuff around the upper part of your left arm. The cuff should be 1 inch (2.5 cm) above your elbow. It is best to wrap the cuff around bare skin. 6. Fit the cuff snugly around your arm. You should be able to place only one finger between the cuff and your arm. 7. Put the cord inside the groove of your elbow. 8. Press the power button. 9. Sit quietly while the cuff fills with air and loses air. 10. Write down the numbers on the screen. 11. Wait 2-3 minutes and then repeat steps 1-10.  What do the numbers mean? Two numbers make up your blood pressure. The first number is called systolic pressure. The second is called diastolic pressure. An example of a blood pressure reading is "120  over 80" (or 120/80). If you are an adult and do not have a medical condition, use this guide to find out if your blood pressure is normal: Normal  First number: below 120.  Second number: below 80. Elevated  First number: 120-129.  Second number: below 80. Hypertension stage 1  First number: 130-139.  Second number: 80-89. Hypertension stage 2  First number: 140 or  above.  Second number: 90 or above. Your blood pressure is above normal even if only the top or bottom number is above normal. Follow these instructions at home:  Check your blood pressure as often as your doctor tells you to.  Take your monitor to your next doctor's appointment. Your doctor will: ? Make sure you are using it correctly. ? Make sure it is working right.  Make sure you understand what your blood pressure numbers should be.  Tell your doctor if your medicines are causing side effects. Contact a doctor if:  Your blood pressure keeps being high. Get help right away if:  Your first blood pressure number is higher than 180.  Your second blood pressure number is higher than 120. This information is not intended to replace advice given to you by your health care provider. Make sure you discuss any questions you have with your health care provider. Document Released: 11/06/2008 Document Revised: 10/22/2016 Document Reviewed: 05/02/2016 Elsevier Interactive Patient Education  2018 [employer].      IF you received an x-ray today, you will receive an invoice from [facility]. Please contact [facility] at [phone] with questions or concerns regarding your invoice.   IF you received labwork today, you will receive an invoice from [facility]. Please contact [facility] at [phone] with questions or concerns regarding your invoice.   Our billing staff will not be able to assist you with questions regarding bills from these companies.  You will be contacted with the lab  results as soon as they are available. The fastest way to get your results is to activate your My Chart account. Instructions are located on the last page of this paperwork. If you have not heard from [place] regarding the results in 2 weeks, please contact this office.

## 2017-08-18 NOTE — Progress Notes (Signed)
MRN: [id number] DOB: 23-Jul-1976  Subjective:   Tara Livingston is a 41 y.o. female presenting for follow up on elevated blood pressure readings. She went to the dentist last week and had elevated bp readings at 179/99, 168/103, and 175/109. She then went to have metabolic screen for work today and had bp readings 170/110 and 176/112. Pt is not currently fasting.   Reports no symptoms. Denies lightheadedness, dizziness, chronic headache, double vision, chest pain, shortness of breath, heart racing, palpitations, nausea, vomiting, abdominal pain, hematuria, lower leg swelling. Eats fast food 2-3 times a day. Likes fruits and veggies but finds it easier to just eat out. Drinks soda and water daily. Does not engage in structured exercise right now but does have a gym membership to [employer].  Denies current smoking. Quit about 9 years ago. Occasional alcohol use. Has PMH of T2DM. No personal hx of heart disease, thyroid disease, stroke, HLD, or gout. Has FH of HTN in mother.   Tara Livingston has a current medication list which includes the following prescription(s): aspirin ec, empagliflozin, insulin syringes (disposable), levemir flextouch, metformin, victoza, amlodipine, and hydrochlorothiazide. Also has No Known Allergies.  Tara Livingston  has a past medical history of Diabetes mellitus without complication ([place]). Also  has a past surgical history that includes Cesarean section and Breast surgery.   Objective:   Vitals: BP (!) 165/99   Pulse 76   Temp 97.8 F (36.6 C) (Oral)   Resp 18   Ht 5' 5.5" (1.664 m)   Wt (!) 326 lb 6.4 oz (148.1 kg)   SpO2 98%   BMI 53.49 kg/m   Physical Exam  Constitutional: She is oriented to person, place, and time. She appears well-developed and well-nourished.  HENT:  Head: Normocephalic and atraumatic.  Mouth/Throat: Uvula is midline, oropharynx is clear and moist and mucous membranes are normal.  Eyes: Pupils are equal, round, and reactive to light. Conjunctivae  and EOM are normal.  Neck: Normal range of motion. No thyroid mass and no thyromegaly present.  Cardiovascular: Normal rate, regular rhythm, normal heart sounds and intact distal pulses.   Pulmonary/Chest: Effort normal and breath sounds normal. She has no wheezes. She has no rhonchi. She has no rales.  Neurological: She is alert and oriented to person, place, and time.  Skin: Skin is warm and dry.  Psychiatric: She has a normal mood and affect.  Vitals reviewed.   No results found for this or any previous visit (from the past 24 hour(s)).  Wt Readings from Last 3 Encounters:  08/18/17 (!) 326 lb 6.4 oz (148.1 kg)  03/07/17 (!) 311 lb 8 oz (141.3 kg)   EKG shows sinus rhythm with rate of 86 bpm. PR and QRS intervals within normal limits. No acute ST or T-wave abnormalities noted. No prior EKG for comparison. Findings presented and discussed with Dr. Mitchel Honour  Assessment and Plan :  1. Essential hypertension Uncontrolled in office. Pt is asymptomatic. Pt appears stable. EKG stable. Labs pending. Will initiate blood pressure medication today. Patient also encouraged to decrease amount of time she eats fast food per week. Encouraged to start engaging in structured exercise. Instructed to check bp outside of office over the next week. Document these values. Return to office in 1-2 weeks for reevaluation of blood pressure Given strict ED precautions. Given educational material on managing hypertension and on dash diet  eating plan. - CBC with Differential/Platelet - CMP14+EGFR - TSH - POCT urinalysis dipstick - EKG 12-Lead - amLODipine (  NORVASC) 5 MG tablet; Take 1 tablet (5 mg total) by mouth daily.  Dispense: 90 tablet; Refill: 1 - hydrochlorothiazide (MICROZIDE) 12.5 MG capsule; Take 1 capsule (12.5 mg total) by mouth daily.  Dispense: 90 capsule; Refill: 1  2. Type 2 diabetes mellitus with complication, with long-term current use of insulin ([place]) - Ambulatory referral to diabetic  education  Tenna Delaine, PA-C  [facility] 08/22/2017 3:24 PM

## 2017-08-19 LAB — CMP14+EGFR
ALK PHOS: 83 IU/L (ref 39–117)
ALT: 28 IU/L (ref 0–32)
AST: 25 IU/L (ref 0–40)
Albumin/Globulin Ratio: 1.2 (ref 1.2–2.2)
Albumin: 4.3 g/dL (ref 3.5–5.5)
BILIRUBIN TOTAL: 0.3 mg/dL (ref 0.0–1.2)
BUN/Creatinine Ratio: 15 (ref 9–23)
BUN: 15 mg/dL (ref 6–24)
CHLORIDE: 100 mmol/L (ref 96–106)
CO2: 20 mmol/L (ref 20–29)
Calcium: 10.5 mg/dL — ABNORMAL HIGH (ref 8.7–10.2)
Creatinine, Ser: 1.01 mg/dL — ABNORMAL HIGH (ref 0.57–1.00)
GFR calc Af Amer: 80 mL/min/{1.73_m2} (ref >59)
GFR calc non Af Amer: 69 mL/min/{1.73_m2} (ref >59)
Globulin, Total: 3.7 g/dL (ref 1.5–4.5)
Glucose: 175 mg/dL — ABNORMAL HIGH (ref 65–99)
Potassium: 4.3 mmol/L (ref 3.5–5.2)
Sodium: 139 mmol/L (ref 134–144)
TOTAL PROTEIN: 8 g/dL (ref 6.0–8.5)

## 2017-08-19 LAB — CBC WITH DIFFERENTIAL/PLATELET
Basophils Absolute: 0 10*3/uL (ref 0.0–0.2)
Basos: 0 %
EOS (ABSOLUTE): 0.1 10*3/uL (ref 0.0–0.4)
EOS: 1 %
HEMOGLOBIN: 15.6 g/dL (ref 11.1–15.9)
Hematocrit: 45.9 % (ref 34.0–46.6)
IMMATURE GRANS (ABS): 0 10*3/uL (ref 0.0–0.1)
Immature Granulocytes: 0 %
Lymphocytes Absolute: 3.3 10*3/uL — ABNORMAL HIGH (ref 0.7–3.1)
Lymphs: 41 %
MCH: 30.1 pg (ref 26.6–33.0)
MCHC: 34 g/dL (ref 31.5–35.7)
MCV: 88 fL (ref 79–97)
MONOCYTES: 4 %
Monocytes Absolute: 0.3 10*3/uL (ref 0.1–0.9)
Neutrophils Absolute: 4.2 10*3/uL (ref 1.4–7.0)
Neutrophils: 54 %
Platelets: 353 10*3/uL (ref 150–379)
RBC: 5.19 x10E6/uL (ref 3.77–5.28)
RDW: 14.2 % (ref 12.3–15.4)
WBC: 8 10*3/uL (ref 3.4–10.8)

## 2017-08-19 LAB — TSH: TSH: 1.06 u[IU]/mL (ref 0.450–4.500)

## 2017-09-09 ENCOUNTER — Other Ambulatory Visit: Payer: Self-pay | Admitting: Family Medicine

## 2017-09-09 NOTE — Telephone Encounter (Signed)
mychart message sent to pt about making an apt

## 2017-09-16 ENCOUNTER — Telehealth: Payer: Self-pay

## 2017-09-16 NOTE — Telephone Encounter (Signed)
Patient called me back and we updated her records in her chart. She stated that the other things she would get at the end of the year.

## 2017-09-16 NOTE — Telephone Encounter (Signed)
LMVM to CB to review overdue health maintenance.  Left my number to my direct line for her to call.

## 2017-09-17 ENCOUNTER — Ambulatory Visit: Payer: Managed Care, Other (non HMO) | Admitting: Skilled Nursing Facility1

## 2017-09-28 ENCOUNTER — Encounter: Payer: 59 | Attending: Physician Assistant | Admitting: Skilled Nursing Facility1

## 2017-09-28 ENCOUNTER — Encounter: Payer: Self-pay | Admitting: Skilled Nursing Facility1

## 2017-09-28 DIAGNOSIS — E118 Type 2 diabetes mellitus with unspecified complications: Secondary | ICD-10-CM | POA: Insufficient documentation

## 2017-09-28 DIAGNOSIS — Z713 Dietary counseling and surveillance: Secondary | ICD-10-CM | POA: Diagnosis not present

## 2017-09-28 DIAGNOSIS — Z6841 Body Mass Index (BMI) 40.0 and over, adult: Secondary | ICD-10-CM | POA: Diagnosis not present

## 2017-09-28 DIAGNOSIS — E119 Type 2 diabetes mellitus without complications: Secondary | ICD-10-CM

## 2017-09-28 DIAGNOSIS — Z794 Long term (current) use of insulin: Secondary | ICD-10-CM | POA: Insufficient documentation

## 2017-09-28 NOTE — Progress Notes (Signed)
Diabetes Self-Management Education  Visit Type: First/Initial   09/29/2017  Ms. Tara Livingston, identified by name and date of birth, is a 41 y.o. female with a diagnosis of Diabetes: Type 2.   ASSESSMENT  Height 5' 5"  (1.651 m), weight (!) 315 lb 14.4 oz (143.3 kg). Body mass index is 52.57 kg/m.    Pt states when she was first put on metformin she just filled and never took it. Pt admits to only taking her insulin correctly but not her other medications.  Pt states she has IBS. Pt states she has been doing weight watchers for 2 weeks and has been on it before. Pt states she normally drinks 5-6 large coffees to one 12 ounce coffee with 10 creams and 10 sugars. Pt states she typically skips breakfast and lunch on the weekends. Pt states she loves to thrift shop. Pt states she is into Clubbing not sleeping until 12-am.Pt states she has a fear of defecating because it is so dirty.  At the end of the appointment after the education pt seemed to understand the importance of controlling her diabetes.  To work on next time: carbohydrate counting.       Diabetes Self-Management Education - 09/28/17 1545      Visit Information   Visit Type First/Initial     Initial Visit   Diabetes Type Type 2   Are you currently following a meal plan? Yes   What type of meal plan do you follow? weight watchers   Are you taking your medications as prescribed? No   Date Diagnosed 2008     Health Coping   How would you rate your overall health? Fair     Psychosocial Assessment   Patient Belief/Attitude about Diabetes Defeat/Burnout     Pre-Education Assessment   Patient understands the diabetes disease and treatment process. Needs Instruction   Patient understands incorporating nutritional management into lifestyle. Needs Instruction   Patient undertands incorporating physical activity into lifestyle. Needs Instruction   Patient understands using medications safely. Needs Instruction   Patient  understands monitoring blood glucose, interpreting and using results Needs Instruction   Patient understands prevention, detection, and treatment of acute complications. Needs Instruction   Patient understands prevention, detection, and treatment of chronic complications. Needs Instruction   Patient understands how to develop strategies to address psychosocial issues. Needs Instruction   Patient understands how to develop strategies to promote health/change behavior. Needs Instruction     Complications   Last HgB A1C per patient/outside source 8 %   How often do you check your blood sugar? 0 times/day (not testing)   Have you had a dilated eye exam in the past 12 months? Yes   Have you had a dental exam in the past 12 months? Yes   Are you checking your feet? Yes   How many days per week are you checking your feet? 7     Dietary Intake   Breakfast blueberry bagel with cream cheese and strawberry jam----fast food   Snack (morning) fruit cup   Lunch baked zucchini, rice, baked breaded chicken parmasean---taco bell   Snack (afternoon) cheese pizza    Dinner mashed potatoes----mcdonalds    Beverage(s) coffee, pepsi zero, sweet tea     Exercise   Exercise Type Light (walking / raking leaves)   How many days per week to you exercise? 2   How many minutes per day do you exercise? 30   Total minutes per week of exercise 60  Patient Education   Previous Diabetes Education No   Disease state  Factors that contribute to the development of diabetes   Nutrition management  Role of diet in the treatment of diabetes and the relationship between the three main macronutrients and blood glucose level   Physical activity and exercise  Role of exercise on diabetes management, blood pressure control and cardiac health.   Monitoring Purpose and frequency of SMBG.;Taught/evaluated SMBG meter.;Yearly dilated eye exam;Daily foot exams   Chronic complications Relationship between chronic complications and  blood glucose control     Outcomes   Expected Outcomes Demonstrated interest in learning. Expect positive outcomes   Program Status Not Completed      Individualized Plan for Diabetes Self-Management Training:   Learning Objective:  Patient will have a greater understanding of diabetes self-management. Patient education plan is to attend individual and/or group sessions per assessed needs and concerns.   Plan:   There are no Patient Instructions on file for this visit.  Expected Outcomes:  Demonstrated interest in learning. Expect positive outcomes  Education material provided: Living Well with Diabetes, Meal plan card, My Plate and Snack sheet  If problems or questions, patient to contact team via:  Phone  Future DSME appointment:

## 2017-10-08 ENCOUNTER — Encounter: Payer: Self-pay | Admitting: Family Medicine

## 2017-10-08 ENCOUNTER — Ambulatory Visit (INDEPENDENT_AMBULATORY_CARE_PROVIDER_SITE_OTHER): Payer: 59 | Admitting: Family Medicine

## 2017-10-08 VITALS — BP 118/76 | HR 99 | Temp 98.5°F | Resp 16 | Ht 65.0 in | Wt 310.8 lb

## 2017-10-08 DIAGNOSIS — Z794 Long term (current) use of insulin: Secondary | ICD-10-CM

## 2017-10-08 DIAGNOSIS — Z5181 Encounter for therapeutic drug level monitoring: Secondary | ICD-10-CM

## 2017-10-08 DIAGNOSIS — R252 Cramp and spasm: Secondary | ICD-10-CM

## 2017-10-08 DIAGNOSIS — E118 Type 2 diabetes mellitus with unspecified complications: Secondary | ICD-10-CM

## 2017-10-08 DIAGNOSIS — I10 Essential (primary) hypertension: Secondary | ICD-10-CM

## 2017-10-08 LAB — GLUCOSE, POCT (MANUAL RESULT ENTRY): POC Glucose: 433 mg/dl — AB (ref 70–99)

## 2017-10-08 LAB — POCT GLYCOSYLATED HEMOGLOBIN (HGB A1C): Hemoglobin A1C: 14

## 2017-10-08 MED ORDER — TRIAMTERENE-HCTZ 37.5-25 MG PO TABS: 1.0000 | ORAL_TABLET | Freq: Every day | ORAL | 1 refills | Status: DC

## 2017-10-08 MED ORDER — INSULIN DETEMIR 100 UNIT/ML FLEXPEN: 25.0000 [IU] | PEN_INJECTOR | Freq: Every day | SUBCUTANEOUS | 1 refills | Status: DC

## 2017-10-08 MED ORDER — EMPAGLIFLOZIN 25 MG PO TABS: 25.0000 mg | ORAL_TABLET | Freq: Every day | ORAL | 0 refills | Status: DC

## 2017-10-08 MED ORDER — EXENATIDE ER 2 MG ~~LOC~~ PEN: 2.0000 mg | PEN_INJECTOR | SUBCUTANEOUS | 1 refills | Status: DC

## 2017-10-08 MED ORDER — METFORMIN HCL ER (OSM) 1000 MG PO TB24: 2000.0000 mg | ORAL_TABLET | Freq: Every day | ORAL | 1 refills | Status: DC

## 2017-10-08 NOTE — Progress Notes (Signed)
Subjective:    Patient ID: Tara Livingston, female    DOB: 1976-01-08, 41 y.o.   MRN: [id number] Chief Complaint  Patient presents with  . Leg Pain    leg and cramp pains, since taking the norvasc     HPI  Had been on hctz 12.5 years ago so she knew that was fine Lisinopril caused leg cramping. Started on amlodipine 6 wks ago and immed caused cramping which resolved a few days after stopping the amlodipine. Then she restarted the amlodipine last week when BP is high again and her legs started cramping again 2d later so stopped the amlodipine for a second time and cramping has persisted since though she stopped the amlodipine immediately when the cramping began - so 5d ago - and the cramping has continued and has been severe though is gradually slowly improving.  Has been duoing pickle juice, apple cider vinegar, mustard, gatorade Has been drinking a lot of water due to being on weight watchers and trying to loose weight. Doing heating pad and stretching before bed, rubbing and didn't have one last night.  Did have a cramp in her foot today when she was napping and then had severe leg cramp last Tues night (48 hrs ago).  All of meds need to be 90d supplies so has not been on victoza for several months.   Stopped   Was under more stress at work when her BP spiked but has just decided not to mentally worry about it.  HTN: 158/97 but was in a bad mood at her dentist and has had a lot of prior bad dental exerperiences.. Does have wrist cuff at home but unsure of accuracy as reads "error."  DMII: Diagnosed .   Lab Results  Component Value Date   HGBA1C 9.2 03/07/2017   CBGs: fasting a.m. ; after meal  ; No hypoglycemic episodes.  Meter type:  Diet:  Exercising:  DM Med Regimen: Prior changes:   eGFR:  Baseline Cr:  Last checked . Microalb: Done . Normal. On acei Lipids:  LDL ,  non-HDL .  Last levels done  - were improving from prior. On statin. Taking asa 81 qd.  Optho: Seen  annually by - last exam  Feet: Monofilament exam done . Denies any no problems.  Not seen by podiatry prior.  Immunizations:  Influenza:  Pneumovax-23:  Past Medical History:  Diagnosis Date  . Diabetes mellitus without complication [facility])    Past Surgical History:  Procedure Laterality Date  . BREAST SURGERY    . CESAREAN SECTION     Current Outpatient Medications on File Prior to Visit  Medication Sig Dispense Refill  . aspirin EC 81 MG tablet Take 1 tablet (81 mg total) by mouth every other day.    . Insulin Syringes, Disposable, U-100 1 ML MISC Use daily with insulin levimir 17 units 199 each 0   No current facility-administered medications on file prior to visit.    No Known Allergies History reviewed. No pertinent family history. Social History   Socioeconomic History  . Marital status: Single    Spouse name: None  . Number of children: None  . Years of education: None  . Highest education level: None  Social Needs  . Financial resource strain: None  . Food insecurity - worry: None  . Food insecurity - inability: None  . Transportation needs - medical: None  . Transportation needs - non-medical: None  Occupational History  . None  Tobacco Use  .  Smoking status: Former Research scientist (life sciences)  . Smokeless tobacco: Never Used  Substance and Sexual Activity  . Alcohol use: Yes  . Drug use: No  . Sexual activity: Yes    Birth control/protection: IUD  Other Topics Concern  . None  Social History Narrative  . None   Depression screen [facility] 2/9 10/08/2017 09/28/2017 08/18/2017 03/07/2017  Decreased Interest 0 0 0 0  Down, Depressed, Hopeless 0 0 0 0  PHQ - 2 Score 0 0 0 0    Review of Systems See hpi    Objective:   Physical Exam  Constitutional: She is oriented to person, place, and time. She appears well-developed and well-nourished. No distress.  HENT:  Head: Normocephalic and atraumatic.  Right Ear: External ear normal.  Left Ear: External ear normal.  Eyes:  Conjunctivae are normal. No scleral icterus.  Neck: Normal range of motion. Neck supple. No thyromegaly present.  Cardiovascular: Normal rate, regular rhythm, normal heart sounds and intact distal pulses.  Pulmonary/Chest: Effort normal and breath sounds normal. No respiratory distress.  Musculoskeletal: She exhibits no edema.  Lymphadenopathy:    She has no cervical adenopathy.  Neurological: She is alert and oriented to person, place, and time.  Skin: Skin is warm and dry. She is not diaphoretic. No erythema.  Psychiatric: She has a normal mood and affect. Her behavior is normal.      BP 118/76   Pulse 99   Temp 98.5 F (36.9 C)   Resp 16   Ht _0  (1.651 m)   Wt (!) 310 lb 12.8 oz (141 kg)   SpO2 99%   BMI 51.72 kg/m   Results for orders placed or performed in visit on 22/97/98  Basic metabolic panel  Result Value Ref Range   Glucose 405 (H) 65 - 99 mg/dL   BUN 16 6 - 24 mg/dL   Creatinine, Ser 0.99 0.57 - 1.00 mg/dL   GFR calc non Af Amer 71 >59 mL/min/1.73   GFR calc Af Amer 82 >59 mL/min/1.73   BUN/Creatinine Ratio 16 9 - 23   Sodium 131 (L) 134 - 144 mmol/L   Potassium 4.4 3.5 - 5.2 mmol/L   Chloride 94 (L) 96 - 106 mmol/L   CO2 22 20 - 29 mmol/L   Calcium 10.2 8.7 - 10.2 mg/dL  CK  Result Value Ref Range   Total CK 253 (H) 24 - 173 U/L  Magnesium  Result Value Ref Range   Magnesium 1.9 1.6 - 2.3 mg/dL  POCT glycosylated hemoglobin (Hb A1C)  Result Value Ref Range   Hemoglobin A1C 14.0   POCT glucose (manual entry)  Result Value Ref Range   POC Glucose 433 (A) 70 - 99 mg/dl    Assessment & Plan:  She requests her labs be sent to [facility] or [facility].   1. Essential hypertension - had severe leg cramping w/ both amlodipine and lisinopril, tol hctz fine for years - change to maxzide as bp has been quite uncontrolled outside office   2. Type 2 diabetes mellitus with complication, with long-term current use of insulin ([place])   3. Medication monitoring encounter    4. Leg cramping   5. Muscle cramp     Orders Placed This Encounter  Procedures  . Basic metabolic panel    Order Specific Question:   Has the patient fasted?    Answer:   No  . CK  . Magnesium  . POCT glycosylated hemoglobin (Hb A1C)  . POCT glucose (  manual entry)  . EKG 12-Lead  . HM DIABETES FOOT EXAM    Meds ordered this encounter  Medications  . Insulin Detemir (LEVEMIR FLEXTOUCH) 100 UNIT/ML Pen    Sig: Inject 25 Units into the skin daily.    Dispense:  10 pen    Refill:  1  . Exenatide ER 2 MG PEN    Sig: Inject 2 mg into the skin once a week.    Dispense:  12 each    Refill:  1  . metformin (FORTAMET) 1000 MG (OSM) 24 hr tablet    Sig: Take 2 tablets (2,000 mg total) by mouth daily with breakfast.    Dispense:  180 tablet    Refill:  1  . empagliflozin (JARDIANCE) 25 MG TABS tablet    Sig: Take 25 mg by mouth daily.    Dispense:  90 tablet    Refill:  0  . triamterene-hydrochlorothiazide (MAXZIDE-25) 37.5-25 MG tablet    Sig: Take 1 tablet by mouth daily.    Dispense:  90 tablet    Refill:  1    Delman Cheadle, M.D.  Primary Care at [facility] [address] [place], [place] [address] [phone] phone [phone] fax  10/15/17 6:21 AM

## 2017-10-08 NOTE — Patient Instructions (Addendum)
It may be more effective to stop the Jardiance since that it a similar type of medicine to the Victoza/Bydureon and change you to a medication like Januvia (which could be combined w/ your metformin and also have coupons for you to get - possibly for free - so you would have one LESS pill and potentially no/minimall copay). Lets see you back in 3 months and if you are not yet were you want to be, then we could take this next change.  Take the potassium supplement every day for a week. Take the magnesium supplement every night until you get diarrhea and/or the cramps are resolved. If the cramps recur, restart this. There is good data showing the vitamin B complex and vitamin E supplements might help as well - let me know if you need more info about this.       IF you received an x-ray today, you will receive an invoice from [facility]. Please contact [facility] at [phone] with questions or concerns regarding your invoice.   IF you received labwork today, you will receive an invoice from [facility]. Please contact [facility] at [phone] with questions or concerns regarding your invoice.   Our billing staff will not be able to assist you with questions regarding bills from these companies.  You will be contacted with the lab results as soon as they are available. The fastest way to get your results is to activate your My Chart account. Instructions are located on the last page of this paperwork. If you have not heard from [place] regarding the results in 2 weeks, please contact this office.

## 2017-10-09 LAB — BASIC METABOLIC PANEL
BUN / CREAT RATIO: 16 (ref 9–23)
BUN: 16 mg/dL (ref 6–24)
CO2: 22 mmol/L (ref 20–29)
Calcium: 10.2 mg/dL (ref 8.7–10.2)
Chloride: 94 mmol/L — ABNORMAL LOW (ref 96–106)
Creatinine, Ser: 0.99 mg/dL (ref 0.57–1.00)
GFR calc Af Amer: 82 mL/min/{1.73_m2} (ref >59)
GFR calc non Af Amer: 71 mL/min/{1.73_m2} (ref >59)
Glucose: 405 mg/dL — ABNORMAL HIGH (ref 65–99)
Potassium: 4.4 mmol/L (ref 3.5–5.2)
SODIUM: 131 mmol/L — AB (ref 134–144)

## 2017-10-09 LAB — CK: Total CK: 253 U/L — ABNORMAL HIGH (ref 24–173)

## 2017-10-09 LAB — MAGNESIUM: Magnesium: 1.9 mg/dL (ref 1.6–2.3)

## 2017-11-04 ENCOUNTER — Ambulatory Visit: Payer: Managed Care, Other (non HMO) | Admitting: Skilled Nursing Facility1

## 2018-01-08 ENCOUNTER — Other Ambulatory Visit: Payer: Self-pay

## 2018-01-08 ENCOUNTER — Encounter: Payer: Self-pay | Admitting: Family Medicine

## 2018-01-08 ENCOUNTER — Ambulatory Visit (INDEPENDENT_AMBULATORY_CARE_PROVIDER_SITE_OTHER): Payer: 59 | Admitting: Family Medicine

## 2018-01-08 VITALS — BP 148/85 | HR 78 | Temp 98.2°F | Resp 16 | Ht 65.75 in | Wt 320.2 lb

## 2018-01-08 DIAGNOSIS — Z6841 Body Mass Index (BMI) 40.0 and over, adult: Secondary | ICD-10-CM | POA: Diagnosis not present

## 2018-01-08 DIAGNOSIS — Z789 Other specified health status: Secondary | ICD-10-CM

## 2018-01-08 DIAGNOSIS — Z5181 Encounter for therapeutic drug level monitoring: Secondary | ICD-10-CM | POA: Diagnosis not present

## 2018-01-08 DIAGNOSIS — E559 Vitamin D deficiency, unspecified: Secondary | ICD-10-CM

## 2018-01-08 DIAGNOSIS — R252 Cramp and spasm: Secondary | ICD-10-CM

## 2018-01-08 DIAGNOSIS — E118 Type 2 diabetes mellitus with unspecified complications: Secondary | ICD-10-CM | POA: Diagnosis not present

## 2018-01-08 DIAGNOSIS — Z794 Long term (current) use of insulin: Secondary | ICD-10-CM

## 2018-01-08 DIAGNOSIS — I10 Essential (primary) hypertension: Secondary | ICD-10-CM | POA: Diagnosis not present

## 2018-01-08 DIAGNOSIS — J0101 Acute recurrent maxillary sinusitis: Secondary | ICD-10-CM

## 2018-01-08 LAB — GLUCOSE, POCT (MANUAL RESULT ENTRY): POC GLUCOSE: 133 mg/dL — AB (ref 70–99)

## 2018-01-08 LAB — POCT GLYCOSYLATED HEMOGLOBIN (HGB A1C): Hemoglobin A1C: 10.1

## 2018-01-08 MED ORDER — AMOXICILLIN-POT CLAVULANATE 875-125 MG PO TABS: 1.0000 | ORAL_TABLET | Freq: Two times a day (BID) | ORAL | 0 refills | Status: DC

## 2018-01-08 MED ORDER — INSULIN DETEMIR 100 UNIT/ML FLEXPEN: 25.0000 [IU] | PEN_INJECTOR | Freq: Every day | SUBCUTANEOUS | 1 refills | Status: DC

## 2018-01-08 MED ORDER — HYDROCOD POLST-CPM POLST ER 10-8 MG/5ML PO SUER: 5.0000 mL | Freq: Two times a day (BID) | ORAL | 0 refills | Status: DC | PRN

## 2018-01-08 MED ORDER — HYDROCHLOROTHIAZIDE 12.5 MG PO TABS: 12.5000 mg | ORAL_TABLET | Freq: Every day | ORAL | 3 refills | Status: DC

## 2018-01-08 MED ORDER — EXENATIDE ER 2 MG/0.85ML ~~LOC~~ AUIJ: 2.0000 mg | AUTO-INJECTOR | SUBCUTANEOUS | 0 refills | Status: DC

## 2018-01-08 NOTE — Progress Notes (Signed)
Subjective:    Patient ID: Tara Livingston, female    DOB: 14-Jun-1976, 42 y.o.   MRN: [id number] Chief Complaint  Patient presents with  . Diabetes    follow up, ("pt stopped taking Triamterene-HCT Maxide 37.5-25 mg, causes leg cramps")  . nasal drainage    x 1 wk, treated with Flonase last wk through tele-o-doc  . sinus pressure    HPI Taking Delsym wasn't cutting so changed to mucinex max DM which has been helping at night.  Flonase making drainage worse.  Was sick when they came to get the flu shot.   Interested in starting the keto diet until she got sick. - doesn't care what she eats so lets herself give anything.  Last week was 182 in the a.m. Because she ate late <8 hrs before rchecking. All cbgs ~2100  BP 130/80   [facility] but her PA Carrol retired.    HTN: Had been on hctz 12.5 years ago so she knew that was fine so last visit we changed the hctz to maxzide.  Unfortunately, also caused leg cramps so had to stop it after a month.  Lisinopril and amlodipine caused leg cramping.     She didn't do it any of the last Mondays  Has been drinking a lot of water due to trying to loose weight.  DMII: Diagnosed .  At last visit, we got her started on her GLP1 Bydureon - was prev on Victoza  but she had been off of for several months.  Lab Results  Component Value Date   HGBA1C 14.0 10/08/2017   HGBA1C 9.2 03/07/2017   CBGs: fasting a.m. ; after meal  ; No hypoglycemic episodes.  Meter type:  Diet:  Exercising:  DM Med Regimen:  Prior changes:   eGFR: 100 Baseline Cr: 0.82 Last checked 01/08/18. Microalb: Done 01/08/18. Normal. Not on acei as lisinopril caused horrible leg cramps Lipids:  LDL 59,  non-HDL 69 .  Last levels done 03/07/17. Not on statin. Taking asa 81 qd.  Optho: Seen annually by ???- last exam ? Feet: Monofilament exam done 10/08/17. Denies any problems.   Immunizations:  Influenza: not yet done this season  Pneumovax-23: ?  Past Medical  History:  Diagnosis Date  . Diabetes mellitus without complication [facility])    Past Surgical History:  Procedure Laterality Date  . BREAST SURGERY    . CESAREAN SECTION     Current Outpatient Medications on File Prior to Visit  Medication Sig Dispense Refill  . aspirin EC 81 MG tablet Take 1 tablet (81 mg total) by mouth every other day.    . empagliflozin (JARDIANCE) 25 MG TABS tablet Take 25 mg by mouth daily. 90 tablet 0  . Exenatide ER 2 MG PEN Inject 2 mg into the skin once a week. 12 each 1  . Insulin Detemir (LEVEMIR FLEXTOUCH) 100 UNIT/ML Pen Inject 25 Units into the skin daily. 10 pen 1  . Insulin Syringes, Disposable, U-100 1 ML MISC Use daily with insulin levimir 17 units 199 each 0  . metformin (FORTAMET) 1000 MG (OSM) 24 hr tablet Take 2 tablets (2,000 mg total) by mouth daily with breakfast. 180 tablet 1  . triamterene-hydrochlorothiazide (MAXZIDE-25) 37.5-25 MG tablet Take 1 tablet by mouth daily. 90 tablet 1   No current facility-administered medications on file prior to visit.    Allergies  Allergen Reactions  . Amlodipine Other (See Comments)    Muscle cramps  . Lisinopril Other (See  Comments)    Muscle cramps   No family history on file. Social History   Socioeconomic History  . Marital status: Single    Spouse name: Not on file  . Number of children: Not on file  . Years of education: Not on file  . Highest education level: Not on file  Social Needs  . Financial resource strain: Not on file  . Food insecurity - worry: Not on file  . Food insecurity - inability: Not on file  . Transportation needs - medical: Not on file  . Transportation needs - non-medical: Not on file  Occupational History  . Not on file  Tobacco Use  . Smoking status: Former Research scientist (life sciences)  . Smokeless tobacco: Never Used  Substance and Sexual Activity  . Alcohol use: Yes  . Drug use: No  . Sexual activity: Yes    Birth control/protection: IUD  Other Topics Concern  . Not on file    Social History Narrative  . Not on file   Depression screen [facility] 2/9 01/08/2018 10/08/2017 09/28/2017 08/18/2017 03/07/2017  Decreased Interest 0 0 0 0 0  Down, Depressed, Hopeless 0 0 0 0 0  PHQ - 2 Score 0 0 0 0 0    Review of Systems  Constitutional: Positive for fatigue. Negative for activity change, appetite change, chills, diaphoresis and fever.  HENT: Positive for congestion, ear pain, rhinorrhea, sinus pressure, sneezing and sore throat. Negative for ear discharge, nosebleeds, postnasal drip, trouble swallowing and voice change.   Eyes: Negative for discharge and itching.  Respiratory: Positive for cough. Negative for shortness of breath.   Cardiovascular: Negative for chest pain.  Gastrointestinal: Negative for abdominal pain, nausea and vomiting.  Musculoskeletal: Negative for neck pain and neck stiffness.  Skin: Negative for rash.  Neurological: Positive for headaches. Negative for dizziness and syncope.  Hematological: Positive for adenopathy.  Psychiatric/Behavioral: Positive for sleep disturbance.   See hpi    Objective:   Physical Exam  Constitutional: She is oriented to person, place, and time. She appears well-developed and well-nourished. She appears ill. No distress.  HENT:  Head: Normocephalic and atraumatic.  Right Ear: External ear and ear canal normal. Tympanic membrane is retracted. No middle ear effusion.  Left Ear: External ear and ear canal normal. Tympanic membrane is retracted.  No middle ear effusion.  Nose: Mucosal edema and rhinorrhea present. Right sinus exhibits maxillary sinus tenderness. Left sinus exhibits maxillary sinus tenderness.  Mouth/Throat: Uvula is midline and mucous membranes are normal. Posterior oropharyngeal erythema present. No oropharyngeal exudate, posterior oropharyngeal edema or tonsillar abscesses.  Eyes: Conjunctivae are normal. Right eye exhibits no discharge. Left eye exhibits no discharge. No scleral icterus.  Neck: Normal  range of motion. Neck supple. No thyromegaly present.  Cardiovascular: Normal rate, regular rhythm, normal heart sounds and intact distal pulses.  Pulmonary/Chest: Effort normal and breath sounds normal. No respiratory distress.  Musculoskeletal: She exhibits no edema.  Lymphadenopathy:       Head (right side): Submandibular adenopathy present. No preauricular and no posterior auricular adenopathy present.       Head (left side): Submandibular adenopathy present. No preauricular and no posterior auricular adenopathy present.    She has no cervical adenopathy.       Right: No supraclavicular adenopathy present.       Left: No supraclavicular adenopathy present.  Neurological: She is alert and oriented to person, place, and time.  Skin: Skin is warm and dry. She is not diaphoretic. No erythema.  Psychiatric: She has a normal mood and affect. Her behavior is normal.     BP (!) 148/85   Pulse 78   Temp 98.2 F (36.8 C) (Oral)   Resp 16   Ht 5' 5.75" (1.67 m)   Wt (!) 320 lb 3.2 oz (145.2 kg)   LMP 01/30/2017   SpO2 100%   BMI 52.08 kg/m   Assessment & Plan:  HAS TO HAVE ANY CHRONIC MED IN 90DAY SUPPLY OR INSURANCE WILL NOT COVER.  Needs PAP, tetanus, hiv - sched CPE.  Rec pt consider establishing with gyn [facility] - she will call.l Needs pneumovax and flu but pt has been ill this winter (and again today) so unable to receive - advised ok to get at pharm or here at RN-only visit if feeling better in 3d.  Due for FASTING labs at next OV.  1. Type 2 diabetes mellitus with complication, with long-term current use of insulin ([place]) - uncontrolled as has not been taking Byrdureon as was old delivery system with large needles - switch to BCise and retry.  Cont Levemir 25u qam, Jardiance 25 qd, and metformin XR 2038m qd. If gets any hypoglycemia now that adding on GLP-1, than decrease Levemir to 20u.   2. Essential hypertension - cannot tolerate lisinopril, amlodipine,  triamterene-hctz due to leg cramps - no leg cramps x 2 mos now that she has been off all BP meds. tol hctz 12.5 prior, has never tolerated any other BP med so that is better than nothing - restart. Pt reports BPs actually not to bad in outpt setting.  3. Medication monitoring encounter   4. Leg cramps - ONLY occur when on amlodipine, lisinopril, maxzide. resolved x >2 mos now off maxzide  5. Acute recurrent maxillary sinusitis   6. Vitamin D deficiency - h/o, restart high dose x 6 mos, then change to 4000-5000u/d otc when complete    Orders Placed This Encounter  Procedures  . CBC with Differential/Platelet  . Vitamin B12  . VITAMIN D 25 Hydroxy (Vit-D Deficiency, Fractures)  . Comprehensive metabolic panel  . Microalbumin/Creatinine Ratio, Urine  . POCT glycosylated hemoglobin (Hb A1C)  . POCT glucose (manual entry)    Meds ordered this encounter  Medications  . hydrochlorothiazide (HYDRODIURIL) 12.5 MG tablet    Sig: Take 1 tablet (12.5 mg total) by mouth daily.    Dispense:  90 tablet    Refill:  3  . DISCONTD: Exenatide ER (BYDUREON BCISE) 2 MG/0.85ML AUIJ    Sig: Inject 2 mg into the skin once a week.    Dispense:  4 pen    Refill:  0    D/C prior bydureon - needle causing subcu hematomas  . chlorpheniramine-HYDROcodone (TUSSIONEX PENNKINETIC ER) 10-8 MG/5ML SUER    Sig: Take 5 mLs by mouth every 12 (twelve) hours as needed.    Dispense:  90 mL    Refill:  0  . Insulin Detemir (LEVEMIR FLEXTOUCH) 100 UNIT/ML Pen    Sig: Inject 25 Units into the skin daily.    Dispense:  10 pen    Refill:  1    This is a 90 days supply  . amoxicillin-clavulanate (AUGMENTIN) 875-125 MG tablet    Sig: Take 1 tablet by mouth 2 (two) times daily.    Dispense:  20 tablet    Refill:  0  . Exenatide ER (BYDUREON BCISE) 2 MG/0.85ML AUIJ    Sig: Inject 2 mg into the skin once a week.  Dispense:  12 pen    Refill:  1    D/C prior bydureon - needle causing subcu hematomas    Delman Cheadle, M.D.   Primary Care at [facility] [address] [place], [place] [address] [phone] phone [phone] fax  01/08/18 3:58 PM

## 2018-01-08 NOTE — Patient Instructions (Addendum)
Check out [facility]  IF you received an x-ray today, you will receive an invoice from [facility]. Please contact [facility] at [phone] with questions or concerns regarding your invoice.   IF you received labwork today, you will receive an invoice from [facility]. Please contact [facility] at [phone] with questions or concerns regarding your invoice.   Our billing staff will not be able to assist you with questions regarding bills from these companies.  You will be contacted with the lab results as soon as they are available. The fastest way to get your results is to activate your My Chart account. Instructions are located on the last page of this paperwork. If you have not heard from [place] regarding the results in 2 weeks, please contact this office.     Sinusitis, Adult Sinusitis is soreness and inflammation of your sinuses. Sinuses are hollow spaces in the bones around your face. Your sinuses are located:  Around your eyes.  In the middle of your forehead.  Behind your nose.  In your cheekbones.  Your sinuses and nasal passages are lined with a stringy fluid (mucus). Mucus normally drains out of your sinuses. When your nasal tissues become inflamed or swollen, the mucus can become trapped or blocked so air cannot flow through your sinuses. This allows bacteria, viruses, and funguses to grow, which leads to infection. Sinusitis can develop quickly and last for 7?10 days (acute) or for more than 12 weeks (chronic). Sinusitis often develops after a cold. What are the causes? This condition is caused by anything that creates swelling in the sinuses or stops mucus from draining, including:  Allergies.  Asthma.  Bacterial or viral infection.  Abnormally shaped bones between the nasal passages.  Nasal growths that contain mucus (nasal polyps).  Narrow sinus openings.  Pollutants, such as chemicals or irritants in the air.  A foreign  object stuck in the nose.  A fungal infection. This is rare.  What increases the risk? The following factors may make you more likely to develop this condition:  Having allergies or asthma.  Having had a recent cold or respiratory tract infection.  Having structural deformities or blockages in your nose or sinuses.  Having a weak immune system.  Doing a lot of swimming or diving.  Overusing nasal sprays.  Smoking.  What are the signs or symptoms? The main symptoms of this condition are pain and a feeling of pressure around the affected sinuses. Other symptoms include:  Upper toothache.  Earache.  Headache.  Bad breath.  Decreased sense of smell and taste.  A cough that may get worse at night.  Fatigue.  Fever.  Thick drainage from your nose. The drainage is often green and it may contain pus (purulent).  Stuffy nose or congestion.  Postnasal drip. This is when extra mucus collects in the throat or back of the nose.  Swelling and warmth over the affected sinuses.  Sore throat.  Sensitivity to light.  How is this diagnosed? This condition is diagnosed based on symptoms, a medical history, and a physical exam. To find out if your condition is acute or chronic, your health care provider may:  Look in your nose for signs of nasal polyps.  Tap over the affected sinus to check for signs of infection.  View the inside of your sinuses using an imaging device that has a light attached (endoscope).  If your health care provider suspects that you have chronic sinusitis, you may also:  Be  tested for allergies.  Have a sample of mucus taken from your nose (nasal culture) and checked for bacteria.  Have a mucus sample examined to see if your sinusitis is related to an allergy.  If your sinusitis does not respond to treatment and it lasts longer than 8 weeks, you may have an MRI or CT scan to check your sinuses. These scans also help to determine how severe your  infection is. In rare cases, a bone biopsy may be done to rule out more serious types of fungal sinus disease. How is this treated? Treatment for sinusitis depends on the cause and whether your condition is chronic or acute. If a virus is causing your sinusitis, your symptoms will go away on their own within 10 days. You may be given medicines to relieve your symptoms, including:  Topical nasal decongestants. They shrink swollen nasal passages and let mucus drain from your sinuses.  Antihistamines. These drugs block inflammation that is triggered by allergies. This can help to ease swelling in your nose and sinuses.  Topical nasal corticosteroids. These are nasal sprays that ease inflammation and swelling in your nose and sinuses.  Nasal saline washes. These rinses can help to get rid of thick mucus in your nose.  If your condition is caused by bacteria, you will be given an antibiotic medicine. If your condition is caused by a fungus, you will be given an antifungal medicine. Surgery may be needed to correct underlying conditions, such as narrow nasal passages. Surgery may also be needed to remove polyps. Follow these instructions at home: Medicines  Take, use, or apply over-the-counter and prescription medicines only as told by your health care provider. These may include nasal sprays.  If you were prescribed an antibiotic medicine, take it as told by your health care provider. Do not stop taking the antibiotic even if you start to feel better. Hydrate and Humidify  Drink enough water to keep your urine clear or pale yellow. Staying hydrated will help to thin your mucus.  Use a cool mist humidifier to keep the humidity level in your home above 50%.  Inhale steam for 10-15 minutes, 3-4 times a day or as told by your health care provider. You can do this in the bathroom while a hot shower is running.  Limit your exposure to cool or dry air. Rest  Rest as much as possible.  Sleep  with your head raised (elevated).  Make sure to get enough sleep each night. General instructions  Apply a warm, moist washcloth to your face 3-4 times a day or as told by your health care provider. This will help with discomfort.  Wash your hands often with soap and water to reduce your exposure to viruses and other germs. If soap and water are not available, use hand sanitizer.  Do not smoke. Avoid being around people who are smoking (secondhand smoke).  Keep all follow-up visits as told by your health care provider. This is important. Contact a health care provider if:  You have a fever.  Your symptoms get worse.  Your symptoms do not improve within 10 days. Get help right away if:  You have a severe headache.  You have persistent vomiting.  You have pain or swelling around your face or eyes.  You have vision problems.  You develop confusion.  Your neck is stiff.  You have trouble breathing. This information is not intended to replace advice given to you by your health care provider. Make sure you  discuss any questions you have with your health care provider. Document Released: 11/24/2005 Document Revised: 07/20/2016 Document Reviewed: 09/19/2015 Elsevier Interactive Patient Education  [employer].

## 2018-01-09 ENCOUNTER — Encounter: Payer: Self-pay | Admitting: Family Medicine

## 2018-01-09 DIAGNOSIS — Z6841 Body Mass Index (BMI) 40.0 and over, adult: Secondary | ICD-10-CM

## 2018-01-09 DIAGNOSIS — Z789 Other specified health status: Secondary | ICD-10-CM | POA: Insufficient documentation

## 2018-01-09 DIAGNOSIS — E559 Vitamin D deficiency, unspecified: Secondary | ICD-10-CM | POA: Insufficient documentation

## 2018-01-09 LAB — MICROALBUMIN / CREATININE URINE RATIO
CREATININE, UR: 141.7 mg/dL
Microalb/Creat Ratio: <2.1 mg/g creat (ref 0.0–30.0)

## 2018-01-09 MED ORDER — VITAMIN D (ERGOCALCIFEROL) 1.25 MG (50000 UNIT) PO CAPS: 50000.0000 [IU] | ORAL_CAPSULE | ORAL | 0 refills | Status: AC

## 2018-01-09 MED ORDER — EXENATIDE ER 2 MG/0.85ML ~~LOC~~ AUIJ: 2.0000 mg | AUTO-INJECTOR | SUBCUTANEOUS | 1 refills | Status: DC

## 2018-01-09 MED ORDER — EMPAGLIFLOZIN 25 MG PO TABS: 25.0000 mg | ORAL_TABLET | Freq: Every day | ORAL | 1 refills | Status: AC

## 2018-01-10 LAB — COMPREHENSIVE METABOLIC PANEL
ALBUMIN: 4.1 g/dL (ref 3.5–5.5)
ALK PHOS: 74 IU/L (ref 39–117)
ALT: 20 IU/L (ref 0–32)
AST: 17 IU/L (ref 0–40)
Albumin/Globulin Ratio: 1.2 (ref 1.2–2.2)
BUN/Creatinine Ratio: 17 (ref 9–23)
BUN: 14 mg/dL (ref 6–24)
Bilirubin Total: 0.3 mg/dL (ref 0.0–1.2)
CALCIUM: 9.6 mg/dL (ref 8.7–10.2)
CO2: 23 mmol/L (ref 20–29)
CREATININE: 0.82 mg/dL (ref 0.57–1.00)
Chloride: 105 mmol/L (ref 96–106)
GFR calc Af Amer: 103 mL/min/{1.73_m2} (ref >59)
GFR, EST NON AFRICAN AMERICAN: 89 mL/min/{1.73_m2} (ref >59)
GLOBULIN, TOTAL: 3.3 g/dL (ref 1.5–4.5)
GLUCOSE: 137 mg/dL — AB (ref 65–99)
Potassium: 4.3 mmol/L (ref 3.5–5.2)
SODIUM: 142 mmol/L (ref 134–144)
TOTAL PROTEIN: 7.4 g/dL (ref 6.0–8.5)

## 2018-01-10 LAB — CBC WITH DIFFERENTIAL/PLATELET
BASOS: 1 %
Basophils Absolute: 0 10*3/uL (ref 0.0–0.2)
EOS (ABSOLUTE): 0.2 10*3/uL (ref 0.0–0.4)
EOS: 3 %
HEMATOCRIT: 41.3 % (ref 34.0–46.6)
HEMOGLOBIN: 13.8 g/dL (ref 11.1–15.9)
IMMATURE GRANS (ABS): 0 10*3/uL (ref 0.0–0.1)
IMMATURE GRANULOCYTES: 0 %
Lymphocytes Absolute: 3.1 10*3/uL (ref 0.7–3.1)
Lymphs: 40 %
MCH: 29.2 pg (ref 26.6–33.0)
MCHC: 33.4 g/dL (ref 31.5–35.7)
MCV: 88 fL (ref 79–97)
MONOCYTES: 5 %
Monocytes Absolute: 0.4 10*3/uL (ref 0.1–0.9)
NEUTROS ABS: 3.9 10*3/uL (ref 1.4–7.0)
NEUTROS PCT: 51 %
Platelets: 383 10*3/uL — ABNORMAL HIGH (ref 150–379)
RBC: 4.72 x10E6/uL (ref 3.77–5.28)
RDW: 13.8 % (ref 12.3–15.4)
WBC: 7.7 10*3/uL (ref 3.4–10.8)

## 2018-01-10 LAB — VITAMIN D 25 HYDROXY (VIT D DEFICIENCY, FRACTURES): Vit D, 25-Hydroxy: 12 ng/mL — ABNORMAL LOW (ref 30.0–100.0)

## 2018-01-10 LAB — VITAMIN B12: Vitamin B-12: 485 pg/mL (ref 232–1245)

## 2018-01-11 ENCOUNTER — Ambulatory Visit: Payer: 59 | Admitting: Family Medicine

## 2018-01-11 ENCOUNTER — Encounter: Payer: Self-pay | Admitting: Family Medicine

## 2018-01-14 ENCOUNTER — Encounter: Payer: Self-pay | Admitting: Family Medicine

## 2018-02-01 ENCOUNTER — Encounter: Payer: Self-pay | Admitting: Family Medicine

## 2018-02-26 ENCOUNTER — Inpatient Hospital Stay (HOSPITAL_COMMUNITY)
Admission: AD | Admit: 2018-02-26 | Discharge: 2018-02-26 | Disposition: A | Discharge status: Home / Self Care | Payer: 59 | Source: Ambulatory Visit | Attending: Obstetrics and Gynecology | Admitting: Obstetrics and Gynecology

## 2018-02-26 ENCOUNTER — Inpatient Hospital Stay (HOSPITAL_COMMUNITY): Payer: 59

## 2018-02-26 ENCOUNTER — Encounter (HOSPITAL_COMMUNITY): Payer: Self-pay

## 2018-02-26 DIAGNOSIS — Z7982 Long term (current) use of aspirin: Secondary | ICD-10-CM | POA: Diagnosis not present

## 2018-02-26 DIAGNOSIS — Z975 Presence of (intrauterine) contraceptive device: Secondary | ICD-10-CM | POA: Diagnosis not present

## 2018-02-26 DIAGNOSIS — R102 Pelvic and perineal pain: Secondary | ICD-10-CM

## 2018-02-26 DIAGNOSIS — I10 Essential (primary) hypertension: Secondary | ICD-10-CM | POA: Insufficient documentation

## 2018-02-26 DIAGNOSIS — Z888 Allergy status to other drugs, medicaments and biological substances status: Secondary | ICD-10-CM | POA: Insufficient documentation

## 2018-02-26 DIAGNOSIS — Z87891 Personal history of nicotine dependence: Secondary | ICD-10-CM | POA: Insufficient documentation

## 2018-02-26 DIAGNOSIS — D259 Leiomyoma of uterus, unspecified: Secondary | ICD-10-CM | POA: Diagnosis not present

## 2018-02-26 DIAGNOSIS — E119 Type 2 diabetes mellitus without complications: Secondary | ICD-10-CM | POA: Insufficient documentation

## 2018-02-26 DIAGNOSIS — Z79899 Other long term (current) drug therapy: Secondary | ICD-10-CM | POA: Diagnosis not present

## 2018-02-26 DIAGNOSIS — Z794 Long term (current) use of insulin: Secondary | ICD-10-CM | POA: Insufficient documentation

## 2018-02-26 HISTORY — DX: Essential (primary) hypertension: I10

## 2018-02-26 LAB — URINALYSIS, ROUTINE W REFLEX MICROSCOPIC
Bilirubin Urine: NEGATIVE
Glucose, UA: >=500 mg/dL — AB
Ketones, ur: NEGATIVE mg/dL
NITRITE: NEGATIVE
PROTEIN: NEGATIVE mg/dL
SPECIFIC GRAVITY, URINE: 1.03 (ref 1.005–1.030)
pH: 5 (ref 5.0–8.0)

## 2018-02-26 NOTE — Discharge Instructions (Signed)
Intrauterine Device Insertion, Care After This sheet gives you information about how to care for yourself after your procedure. Your health care provider may also give you more specific instructions. If you have problems or questions, contact your health care provider. What can I expect after the procedure? After the procedure, it is common to have:  Cramps and pain in the abdomen.  Light bleeding (spotting) or heavier bleeding that is like your menstrual period. This may last for up to a few days.  Lower back pain.  Dizziness.  Headaches.  Nausea.  Follow these instructions at home:  Before resuming sexual activity, check to make sure that you can feel the IUD string(s). You should be able to feel the end of the string(s) below the opening of your cervix. If your IUD string is in place, you may resume sexual activity. ? If you had a hormonal IUD inserted more than 7 days after your most recent period started, you will need to use a backup method of birth control for 7 days after IUD insertion. Ask your health care provider whether this applies to you.  Continue to check that the IUD is still in place by feeling for the string(s) after every menstrual period, or once a month.  Take over-the-counter and prescription medicines only as told by your health care provider.  Do not drive or use heavy machinery while taking prescription pain medicine.  Keep all follow-up visits as told by your health care provider. This is important. Contact a health care provider if:  You have bleeding that is heavier or lasts longer than a normal menstrual cycle.  You have a fever.  You have cramps or abdominal pain that get worse or do not get better with medicine.  You develop abdominal pain that is new or is not in the same area of earlier cramping and pain.  You feel lightheaded or weak.  You have abnormal or bad-smelling discharge from your vagina.  You have pain during sexual  activity.  You have any of the following problems with your IUD string(s): ? The string bothers or hurts you or your sexual partner. ? You cannot feel the string. ? The string has gotten longer.  You can feel the IUD in your vagina.  You think you may be pregnant, or you miss your menstrual period.  You think you may have an STI (sexually transmitted infection). Get help right away if:  You have flu-like symptoms.  You have a fever and chills.  You can feel that your IUD has slipped out of place. Summary  After the procedure, it is common to have cramps and pain in the abdomen. It is also common to have light bleeding (spotting) or heavier bleeding that is like your menstrual period.  Continue to check that the IUD is still in place by feeling for the string(s) after every menstrual period, or once a month.  Keep all follow-up visits as told by your health care provider. This is important.  Contact your health care provider if you have problems with your IUD string(s), such as the string getting longer or bothering you or your sexual partner. This information is not intended to replace advice given to you by your health care provider. Make sure you discuss any questions you have with your health care provider. Document Released: 07/23/2011 Document Revised: 10/15/2016 Document Reviewed: 10/15/2016 Elsevier Interactive Patient Education  2017 [employer].

## 2018-02-26 NOTE — MAU Note (Signed)
Had IUD removed and replaced.  Having right side pelvic and leg pain.

## 2018-02-26 NOTE — MAU Provider Note (Addendum)
Patient Tara Livingston is  A 42 y.o. [id number] non-pregnant female here with pelvic pain after an IUD placement on Wednesday.    History     CSN: [id number]  Arrival date and time: 02/26/18 1153   None     Chief Complaint  Patient presents with  . Pelvic Pain   HPI Patient had an IUD removed and replaced on Wednesday by Dr. Corinna Capra. She was in stirrups for "a long time" on Wednesday because her strings couldn't be found, and then the equipment could not be located.  She says that the pain her hip and leg pain started immediately after the appointment. She was thinking that it was because her legs were in stirrups "the whole time".  She went to the gym on Thursday around 12 and walked on the treadmill. It was not hurting when she woke up on Thursday. The hip and leg pain re-started Thursday afternoon after she had her toenails done. She got concerned after she Googled her symptoms late Thursday night. She called [facility] this morning, and was given an appointment at 11:50. The PA then called back and told her to go to [facility] hospital in case it was a blood clot.   The pain is throbbing pain in her hip and calf. Its not a leg cramp, but a sharp pain. She rates it a 5/10; it is aggravating. She tried Tylenol, which helped a little bit. She took it at 8 am. It is worse when she is driving. It does not hurt when she walks. It does not hurt when she lies.   She denies fever.  OB History    Gravida  1   Para  1   Term  1   Preterm      AB      Living  1     SAB      TAB      Ectopic      Multiple      Live Births              Past Medical History:  Diagnosis Date  . Diabetes mellitus without complication ([place])   . Hypertension    doesn't take medication    Past Surgical History:  Procedure Laterality Date  . BREAST SURGERY    . CESAREAN SECTION      History reviewed. No pertinent family history.  Social History   Tobacco Use  . Smoking status:  Former Research scientist (life sciences)  . Smokeless tobacco: Never Used  Substance Use Topics  . Alcohol use: Yes  . Drug use: No    Allergies:  Allergies  Allergen Reactions  . Amlodipine Other (See Comments)    Muscle cramps  . Lisinopril Other (See Comments)    Muscle cramps    Medications Prior to Admission  Medication Sig Dispense Refill Last Dose  . amoxicillin-clavulanate (AUGMENTIN) 875-125 MG tablet Take 1 tablet by mouth 2 (two) times daily. 20 tablet 0   . aspirin EC 81 MG tablet Take 1 tablet (81 mg total) by mouth every other day.   Taking  . chlorpheniramine-HYDROcodone (TUSSIONEX PENNKINETIC ER) 10-8 MG/5ML SUER Take 5 mLs by mouth every 12 (twelve) hours as needed. 90 mL 0   . empagliflozin (JARDIANCE) 25 MG TABS tablet Take 25 mg by mouth daily. 90 tablet 1   . Exenatide ER (BYDUREON BCISE) 2 MG/0.85ML AUIJ Inject 2 mg into the skin once a week. 12 pen 1   . hydrochlorothiazide (HYDRODIURIL)  12.5 MG tablet Take 1 tablet (12.5 mg total) by mouth daily. 90 tablet 3   . Insulin Detemir (LEVEMIR FLEXTOUCH) 100 UNIT/ML Pen Inject 25 Units into the skin daily. 10 pen 1   . Insulin Syringes, Disposable, U-100 1 ML MISC Use daily with insulin levimir 17 units 199 each 0 Taking  . metformin (FORTAMET) 1000 MG (OSM) 24 hr tablet Take 2 tablets (2,000 mg total) by mouth daily with breakfast. 180 tablet 1 Taking  . Vitamin D, Ergocalciferol, (DRISDOL) 50000 units CAPS capsule Take 1 capsule (50,000 Units total) by mouth every 7 (seven) days. 24 capsule 0     Review of Systems  Constitutional: Negative.   HENT: Negative.   Eyes: Negative.   Respiratory: Negative.   Cardiovascular: Negative.   Gastrointestinal: Negative.   Genitourinary: Negative.   Neurological: Negative.   Psychiatric/Behavioral: Negative.    Physical Exam   Blood pressure (!) 141/96, temperature 98.1 F (36.7 C), resp. rate 18, weight (!) 318 lb (144.2 kg).  Physical Exam  Constitutional: She appears well-developed.   HENT:  Head: Normocephalic.  Neck: Normal range of motion.  Respiratory: Effort normal.  GI: Soft.  Musculoskeletal:  Lower extremities are equal in circumcision bilaterally. No heat, redness, tenderness.   No knots palpated.   MAU Course  Procedures  MDM -physical exam is benign, no evidence of phlebitis or blood clot -IUD appropriately placed in endometrial canal -Reviewed patient's imaging, history and physical exam from patient with Dr. Matthew Saras, who agrees patient is safe for discharge.   Assessment and Plan   1. IUD (intrauterine device) in place   2. Female pelvic pain    2. Patient stable for discharge with recommendation to apply heat and stretch her muscles and avoid strenuous physical activity until she feels better.  3. Return to MAU if her condition worsens or changes.   Mervyn Skeeters Finis Hendricksen CNM 02/26/2018, 1:35 PM

## 2018-04-04 ENCOUNTER — Other Ambulatory Visit: Payer: Self-pay | Admitting: Physician Assistant

## 2018-04-04 DIAGNOSIS — I10 Essential (primary) hypertension: Secondary | ICD-10-CM

## 2019-02-09 IMAGING — US US TRANSVAGINAL NON-OB
1 series · 15 of 25 positions shown · non-contrast
Comparison: None

CLINICAL DATA: Female pelvic pain since IUD placed, history
hypertension, diabetes mellitus

EXAM:
ULTRASOUND PELVIS TRANSVAGINAL
TECHNIQUE: Transvaginal ultrasound examination of the pelvis was performed
including evaluation of the uterus, ovaries, adnexal regions, and
pelvic cul-de-sac. Transabdominal imaging was not ordered

[Series 1: us transvaginal non-ob · 31 acquisitions, 15 frames shown]
[im 1/31]
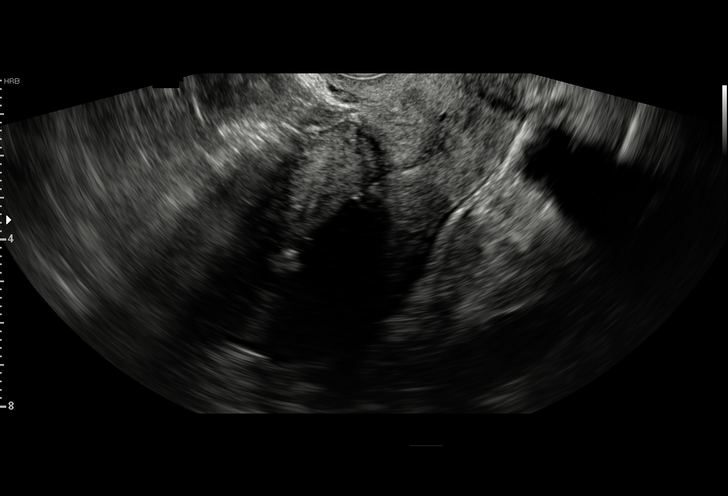
[im 3/31]
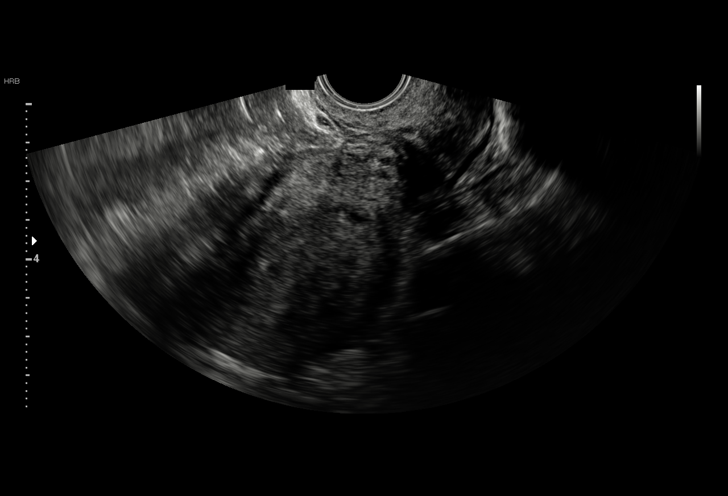
[im 6/31]
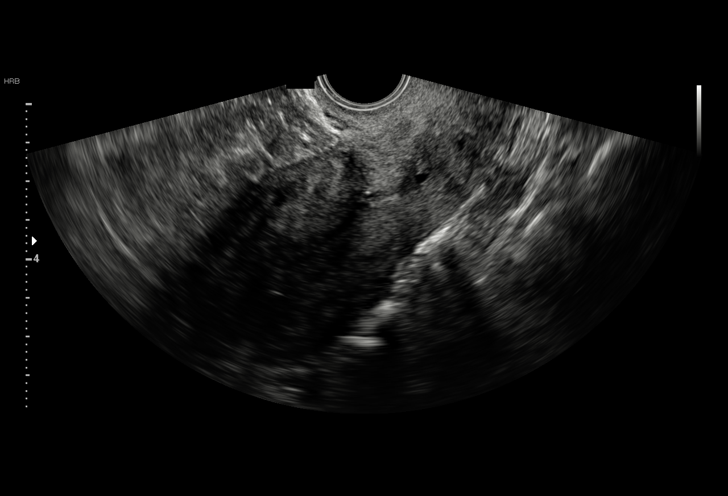
[im 7/31]
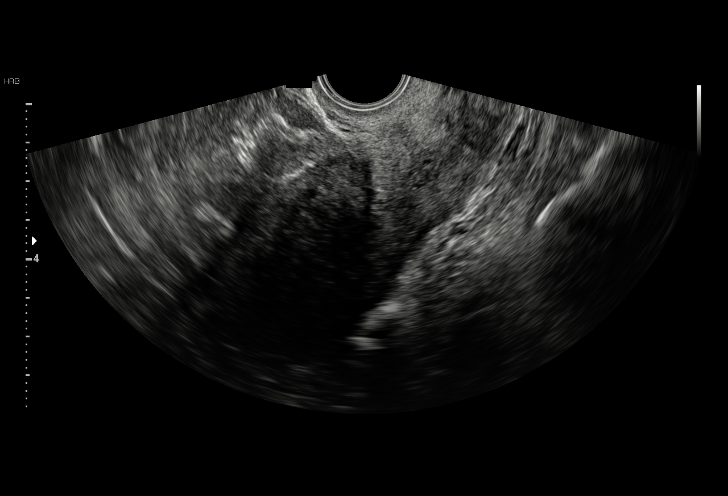
[im 9/31]
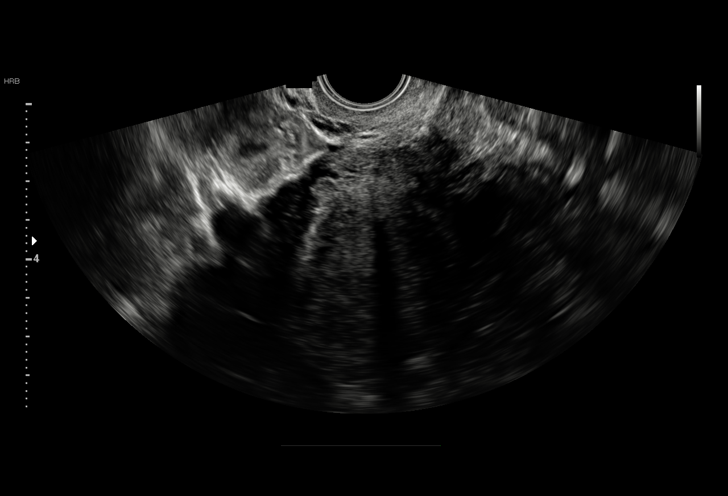
[im 12/31]
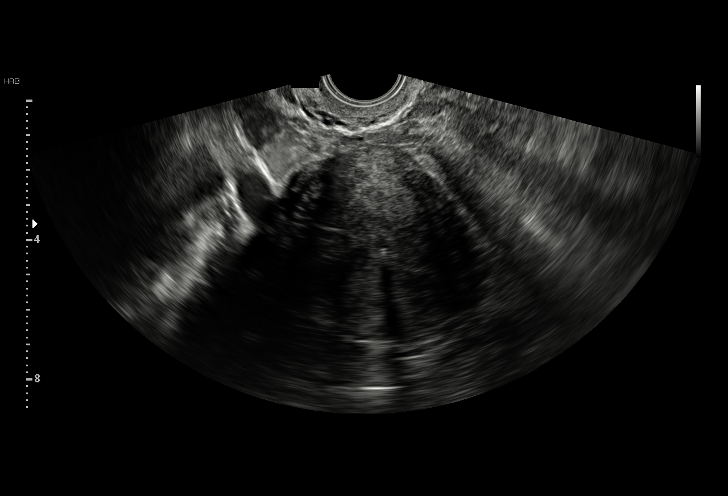
[im 13/31]
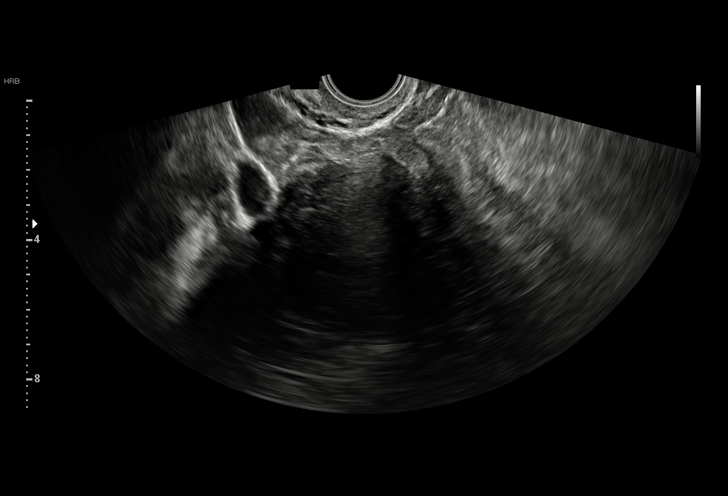
[im 16/31]
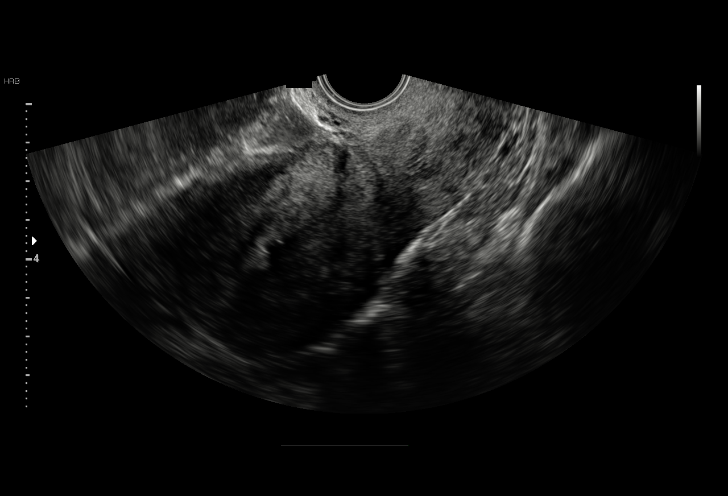
[im 18/31]
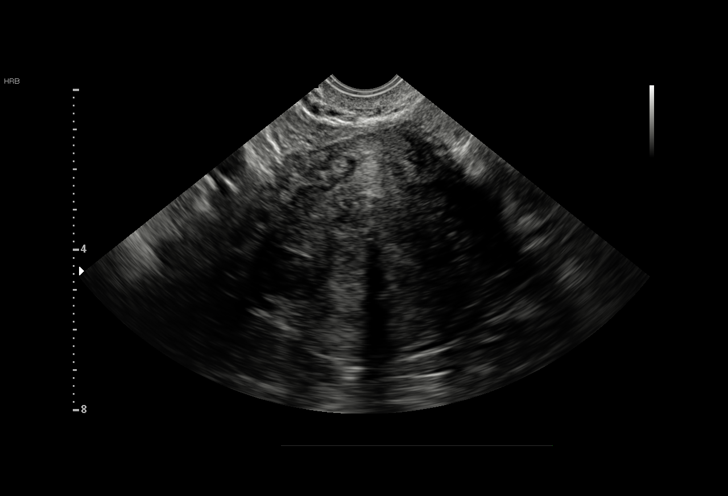
[im 19/31]
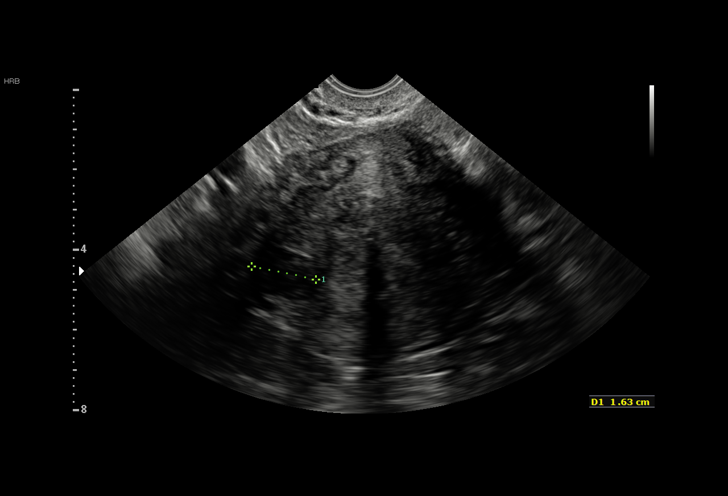
[im 22/31]
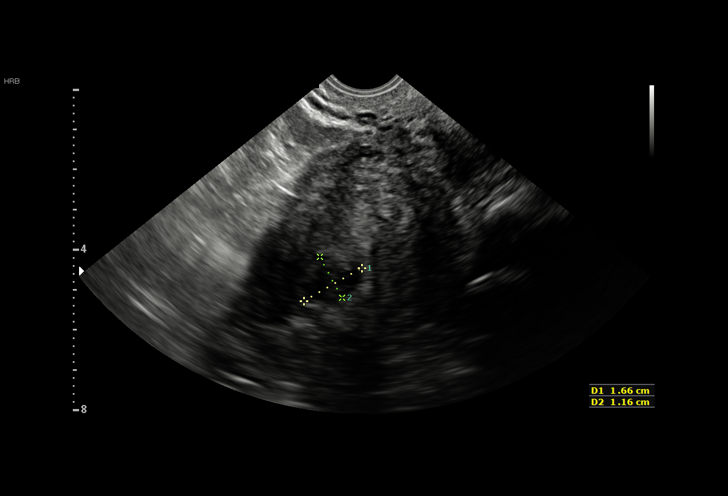
[im 24/31]
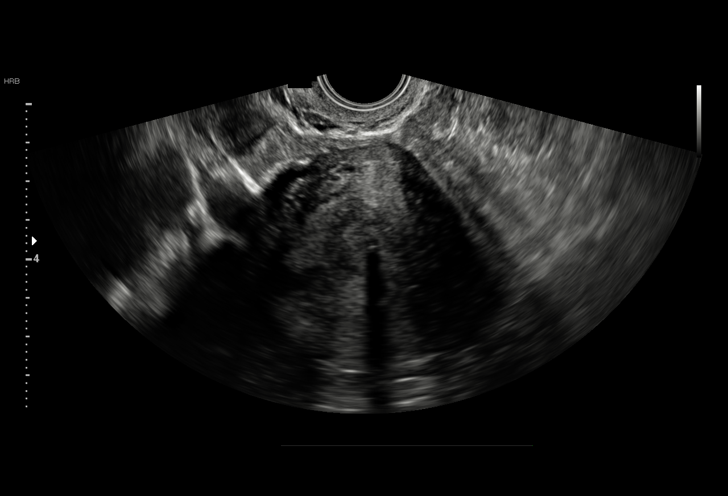
[im 26/31]
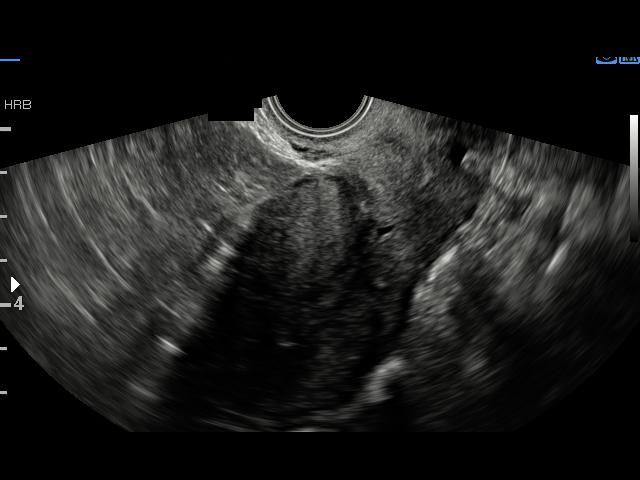
[im 28/31]
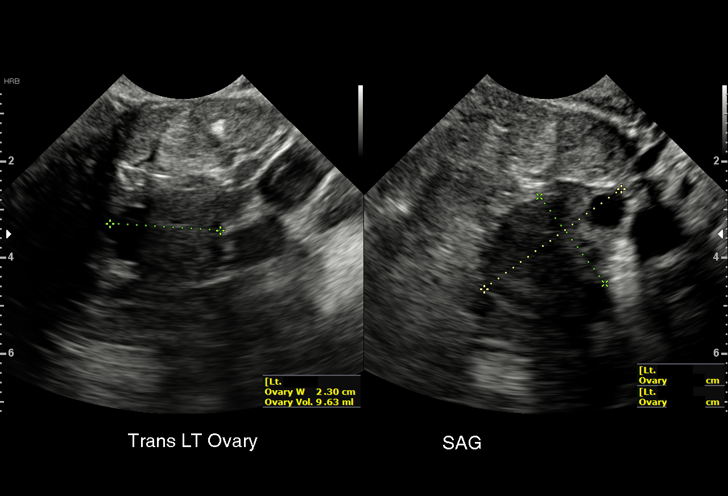
[im 31/31]
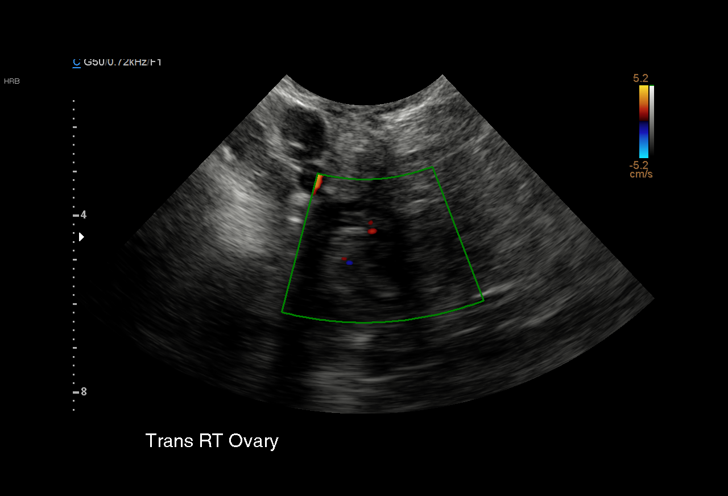

[15 of 25 positions shown; findings below may reference images not displayed]

FINDINGS: Uterus

Measurements: 9.9 x 4.3 x 5.7 cm. Submucosal hypoechoic nodule at
upper uterine segment to RIGHT likely a small leiomyoma 17 x 12 x 16
mm. No additional uterine masses.

Endometrium

Thickness: 7 mm thick. IUD identified at the upper to mid uterine
segments in expected position. No endometrial fluid.

Right ovary

Measurements: 2.9 x 2.1 x 1.7 cm.  Normal morphology without mass

Left ovary

Measurements: 3.5 x 2.3 x 2.3 cm.  Normal morphology without mass

Other findings:  No free pelvic fluid or adnexal masses.
IMPRESSION: Small leiomyoma at upper uterine segment on RIGHT extending
submucosal, measuring 17 x 12 x 16 mm.

Expected position of IUD at upper to mid uterine segments.

## 2019-06-24 ENCOUNTER — Encounter: Payer: Self-pay | Admitting: Podiatry

## 2019-06-24 ENCOUNTER — Ambulatory Visit (INDEPENDENT_AMBULATORY_CARE_PROVIDER_SITE_OTHER): Payer: 59 | Admitting: Podiatry

## 2019-06-24 ENCOUNTER — Other Ambulatory Visit: Payer: Self-pay

## 2019-06-24 VITALS — Temp 98.0°F

## 2019-06-24 DIAGNOSIS — M79675 Pain in left toe(s): Secondary | ICD-10-CM | POA: Diagnosis not present

## 2019-06-24 DIAGNOSIS — Z794 Long term (current) use of insulin: Secondary | ICD-10-CM

## 2019-06-24 DIAGNOSIS — B351 Tinea unguium: Secondary | ICD-10-CM

## 2019-06-24 DIAGNOSIS — E118 Type 2 diabetes mellitus with unspecified complications: Secondary | ICD-10-CM | POA: Diagnosis not present

## 2019-06-24 DIAGNOSIS — M79674 Pain in right toe(s): Secondary | ICD-10-CM | POA: Diagnosis not present

## 2019-06-24 NOTE — Patient Instructions (Signed)
Diabetes Mellitus and Foot Care Foot care is an important part of your health, especially when you have diabetes. Diabetes may cause you to have problems because of poor blood flow (circulation) to your feet and legs, which can cause your skin to:  Become thinner and drier.  Break more easily.  Heal more slowly.  Peel and crack. You may also have nerve damage (neuropathy) in your legs and feet, causing decreased feeling in them. This means that you may not notice minor injuries to your feet that could lead to more serious problems. Noticing and addressing any potential problems early is the best way to prevent future foot problems. How to care for your feet Foot hygiene  Wash your feet daily with warm water and mild soap. Do not use hot water. Then, pat your feet and the areas between your toes until they are completely dry. Do not soak your feet as this can dry your skin.  Trim your toenails straight across. Do not dig under them or around the cuticle. File the edges of your nails with an emery board or nail file.  Apply a moisturizing lotion or petroleum jelly to the skin on your feet and to dry, brittle toenails. Use lotion that does not contain alcohol and is unscented. Do not apply lotion between your toes. Shoes and socks  Wear clean socks or stockings every day. Make sure they are not too tight. Do not wear knee-high stockings since they may decrease blood flow to your legs.  Wear shoes that fit properly and have enough cushioning. Always look in your shoes before you put them on to be sure there are no objects inside.  To break in new shoes, wear them for just a few hours a day. This prevents injuries on your feet. Wounds, scrapes, corns, and calluses  Check your feet daily for blisters, cuts, bruises, sores, and redness. If you cannot see the bottom of your feet, use a mirror or ask someone for help.  Do not cut corns or calluses or try to remove them with medicine.  If you  find a minor scrape, cut, or break in the skin on your feet, keep it and the skin around it clean and dry. You may clean these areas with mild soap and water. Do not clean the area with peroxide, alcohol, or iodine.  If you have a wound, scrape, corn, or callus on your foot, look at it several times a day to make sure it is healing and not infected. Check for: ? Redness, swelling, or pain. ? Fluid or blood. ? Warmth. ? Pus or a bad smell. General instructions  Do not cross your legs. This may decrease blood flow to your feet.  Do not use heating pads or hot water bottles on your feet. They may burn your skin. If you have lost feeling in your feet or legs, you may not know this is happening until it is too late.  Protect your feet from hot and cold by wearing shoes, such as at the beach or on hot pavement.  Schedule a complete foot exam at least once a year (annually) or more often if you have foot problems. If you have foot problems, report any cuts, sores, or bruises to your health care provider immediately. Contact a health care provider if:  You have a medical condition that increases your risk of infection and you have any cuts, sores, or bruises on your feet.  You have an injury that is not   healing.  You have redness on your legs or feet.  You feel burning or tingling in your legs or feet.  You have pain or cramps in your legs and feet.  Your legs or feet are numb.  Your feet always feel cold.  You have pain around a toenail. Get help right away if:  You have a wound, scrape, corn, or callus on your foot and: ? You have pain, swelling, or redness that gets worse. ? You have fluid or blood coming from the wound, scrape, corn, or callus. ? Your wound, scrape, corn, or callus feels warm to the touch. ? You have pus or a bad smell coming from the wound, scrape, corn, or callus. ? You have a fever. ? You have a red line going up your leg. Summary  Check your feet every day  for cuts, sores, red spots, swelling, and blisters.  Moisturize feet and legs daily.  Wear shoes that fit properly and have enough cushioning.  If you have foot problems, report any cuts, sores, or bruises to your health care provider immediately.  Schedule a complete foot exam at least once a year (annually) or more often if you have foot problems. This information is not intended to replace advice given to you by your health care provider. Make sure you discuss any questions you have with your health care provider. Document Released: 11/21/2000 Document Revised: 01/06/2018 Document Reviewed: 12/26/2016 Elsevier Patient Education  2020 [employer]   Onychomycosis/Fungal Toenails  WHAT IS IT? An infection that lies within the keratin of your nail plate that is caused by a fungus.  WHY ME? Fungal infections affect all ages, sexes, races, and creeds.  There may be many factors that predispose you to a fungal infection such as age, coexisting medical conditions such as diabetes, or an autoimmune disease; stress, medications, fatigue, genetics, etc.  Bottom line: fungus thrives in a warm, moist environment and your shoes offer such a location.  IS IT CONTAGIOUS? Theoretically, yes.  You do not want to share shoes, nail clippers or files with someone who has fungal toenails.  Walking around barefoot in the same room or sleeping in the same bed is unlikely to transfer the organism.  It is important to realize, however, that fungus can spread easily from one nail to the next on the same foot.  HOW DO WE TREAT THIS?  There are several ways to treat this condition.  Treatment may depend on many factors such as age, medications, pregnancy, liver and kidney conditions, etc.  It is best to ask your doctor which options are available to you.  1. No treatment.   Unlike many other medical concerns, you can live with this condition.  However for many people this can be a painful condition and may lead to  ingrown toenails or a bacterial infection.  It is recommended that you keep the nails cut short to help reduce the amount of fungal nail. 2. Topical treatment.  These range from herbal remedies to prescription strength nail lacquers.  About 40-50% effective, topicals require twice daily application for approximately 9 to 12 months or until an entirely new nail has grown out.  The most effective topicals are medical grade medications available through physicians offices. 3. Oral antifungal medications.  With an 80-90% cure rate, the most common oral medication requires 3 to 4 months of therapy and stays in your system for a year as the new nail grows out.  Oral antifungal medications do require   blood work to make sure it is a safe drug for you.  A liver function panel will be performed prior to starting the medication and after the first month of treatment.  It is important to have the blood work performed to avoid any harmful side effects.  In general, this medication safe but blood work is required. 4. Laser Therapy.  This treatment is performed by applying a specialized laser to the affected nail plate.  This therapy is noninvasive, fast, and non-painful.  It is not covered by insurance and is therefore, out of pocket.  The results have been very good with a 80-95% cure rate.  [facility] is the only practice in the area to offer this therapy. 5. Permanent Nail Avulsion.  Removing the entire nail so that a new nail will not grow back. 

## 2019-06-28 ENCOUNTER — Ambulatory Visit: Payer: Self-pay | Admitting: Podiatry

## 2019-06-28 ENCOUNTER — Other Ambulatory Visit: Payer: Self-pay

## 2019-06-28 DIAGNOSIS — B351 Tinea unguium: Secondary | ICD-10-CM

## 2019-06-28 DIAGNOSIS — M79675 Pain in left toe(s): Secondary | ICD-10-CM

## 2019-06-29 NOTE — Progress Notes (Signed)
Subjective: Tara Livingston presents today referred by Shawnee Knapp, MD for diabetic foot evaluation.  Patient relates several  year history of diabetes.  Patient denies any history of foot wounds.  Patient denies any history of numbness, tingling, burning, pins/needles sensations.  Today, patient c/o of painful, discolored, thick toenails which interfere with daily activities.  Pain is aggravated when wearing enclosed shoe gear.   Past Medical History:  Diagnosis Date  . Diabetes mellitus without complication ([place])   . Hypertension    doesn't take medication    Patient Active Problem List   Diagnosis Date Noted  . Vitamin D deficiency 01/09/2018  . Class 3 severe obesity due to excess calories with serious comorbidity and body mass index (BMI) of 50.0 to 59.9 in adult ([place]) 01/09/2018  . Medication intolerance 01/09/2018  . Essential hypertension 10/08/2017  . Type 2 diabetes mellitus with complication, with long-term current use of insulin ([place]) 10/08/2017    Past Surgical History:  Procedure Laterality Date  . BREAST SURGERY    . CESAREAN SECTION       Current Outpatient Medications:  .  aspirin EC 81 MG tablet, Take 1 tablet (81 mg total) by mouth every other day., Disp: , Rfl:  .  DULoxetine (CYMBALTA) 20 MG capsule, Take by mouth daily., Disp: , Rfl:  .  empagliflozin (JARDIANCE) 25 MG TABS tablet, Take 25 mg by mouth daily., Disp: 90 tablet, Rfl: 1 .  Exenatide ER (BYDUREON BCISE) 2 MG/0.85ML AUIJ, Inject 2 mg into the skin once a week., Disp: 12 pen, Rfl: 1 .  hydrochlorothiazide (HYDRODIURIL) 12.5 MG tablet, Take 1 tablet (12.5 mg total) by mouth daily. (Patient not taking: Reported on 02/26/2018), Disp: 90 tablet, Rfl: 3 .  Insulin Detemir (LEVEMIR FLEXTOUCH) 100 UNIT/ML Pen, Inject 25 Units into the skin daily., Disp: 10 pen, Rfl: 1 .  Insulin Syringes, Disposable, U-100 1 ML MISC, Use daily with insulin levimir 17 units, Disp: 199 each, Rfl: 0 .  metformin  (FORTAMET) 1000 MG (OSM) 24 hr tablet, Take 2 tablets (2,000 mg total) by mouth daily with breakfast., Disp: 180 tablet, Rfl: 1 .  Vitamin D, Ergocalciferol, (DRISDOL) 50000 units CAPS capsule, Take 1 capsule (50,000 Units total) by mouth every 7 (seven) days. (Patient not taking: Reported on 02/26/2018), Disp: 24 capsule, Rfl: 0  Allergies  Allergen Reactions  . Amlodipine Other (See Comments)    Muscle cramps  . Lisinopril Other (See Comments)    Muscle cramps    Social History   Occupational History  . Not on file  Tobacco Use  . Smoking status: Former Research scientist (life sciences)  . Smokeless tobacco: Never Used  Substance and Sexual Activity  . Alcohol use: Yes  . Drug use: No  . Sexual activity: Yes    Birth control/protection: I.U.D.    History reviewed. No pertinent family history.   There is no immunization history on file for this patient.  Review of systems: Positive Findings in bold print.  Constitutional:  chills, fatigue, fever, sweats, weight change Communication: Optometrist, sign Ecologist, hand writing, iPad/Android device Head: headaches, head injury Eyes: changes in vision, eye pain, glaucoma, cataracts, macular degeneration, diplopia, glare,  light sensitivity, eyeglasses or contacts, blindness Ears nose mouth throat: hearing impaired, hearing aids,  ringing in ears, deaf, sign language,  vertigo, nosebleeds,  rhinitis,  cold sores, snoring, swollen glands Cardiovascular: HTN, edema, arrhythmia, pacemaker in place, defibrillator in place, chest pain/tightness, chronic anticoagulation, blood clot, heart failure, MI Peripheral Vascular:  leg cramps, varicose veins, blood clots, lymphedema, varicosities Respiratory:  difficulty breathing, denies congestion, SOB, wheezing, cough, emphysema Gastrointestinal: change in appetite or weight, abdominal pain, constipation, diarrhea, nausea, vomiting, vomiting blood, change in bowel habits, abdominal pain, jaundice, rectal bleeding,  hemorrhoids, GERD Genitourinary:  nocturia,  pain on urination, polyuria,  blood in urine, Foley catheter, urinary urgency, ESRD on hemodialysis Musculoskeletal: amputation, cramping, stiff joints, painful joints, decreased joint motion, fractures, OA, gout, hemiplegia, paraplegia, uses cane, wheelchair bound, uses walker, uses rollator Skin: +changes in toenails, color change, dryness, itching, mole changes,  rash, wound(s) Neurological: headaches, numbness in feet, paresthesias in feet, burning in feet, fainting,  seizures, change in speech,  headaches, memory problems/poor historian, cerebral palsy, weakness, paralysis, CVA, TIA Endocrine: diabetes, hypothyroidism, hyperthyroidism,  goiter, dry mouth, flushing, heat intolerance,  cold intolerance,  excessive thirst, denies polyuria,  nocturia Hematological:  easy bleeding, excessive bleeding, easy bruising, enlarged lymph nodes, on long term blood thinner, history of past transusions Allergy/immunological:  hives, eczema, frequent infections, multiple drug allergies, seasonal allergies, transplant recipient, multiple food allergies Psychiatric:  anxiety, depression, mood disorder, suicidal ideations, hallucinations, insomnia  Objective: Vitals:   06/24/19 1430  Temp: 98 F (36.7 C)   Vascular Examination: Capillary refill time immediate x 10 digits  Dorsalis pedis pulses palpable b/l.  Posterior tibial pulses palpable b/l.  Digital hair sparse x 10 digits.  Skin temperature gradient WNL b/l.  Dermatological Examination: Skin with normal turgor, texture and tone b/l.  Toenails 1-5 b/l discolored, thick, dystrophic with subungual debris and pain with palpation to nailbeds due to thickness of nails. Incurvated nailplate b/l great toes with tenderness to palpation. No erythema, no edema, no drainage noted.  Musculoskeletal: Muscle strength 5/5 to all LE muscle groups.  Neurological: Sensation intact 5/5 b/l with 10 gram  monofilament.  Vibratory sensation intact b/l.  Assessment: 1. Painful onychomycosis toenails 1-5 b/l  2. NIDDM  Plan: 1. Discussed diabetic foot care principles. Literature dispensed on today. 2. Toenails 1-5 b/l were debrided in length and girth without iatrogenic bleeding. Offending nail borders debrided and curretaged b/l great toes. Borders cleansed with alcohol. Antibiotic ointment applied. No further treatment required by patient. 3. Patient interested in laser therapy for onychomycosis. Will schedule appointment with RN at her convenience. 4. Patient to continue soft, supportive shoe gear 5. Patient to report any pedal injuries to medical professional immediately. 6. Follow up 3 months.  7. Patient/POA to call should there be a concern in the interim.

## 2019-07-29 ENCOUNTER — Ambulatory Visit: Payer: Self-pay | Admitting: Podiatry

## 2019-07-29 ENCOUNTER — Other Ambulatory Visit: Payer: Self-pay

## 2019-07-29 DIAGNOSIS — M79676 Pain in unspecified toe(s): Secondary | ICD-10-CM

## 2019-07-29 DIAGNOSIS — B351 Tinea unguium: Secondary | ICD-10-CM

## 2019-07-29 DIAGNOSIS — M79674 Pain in right toe(s): Secondary | ICD-10-CM

## 2019-07-31 NOTE — Progress Notes (Signed)
Pt presents with mycotic infection of nails 1-5 bilateral.  All other systems are negative  Laser therapy administered to affected nails and tolerated well. All safety precautions were in place.  1st treatment.  Follow up in 4 weeks  Late entry requiring signature. Freddie Breech, DPM

## 2019-08-26 ENCOUNTER — Other Ambulatory Visit: Payer: Self-pay

## 2019-08-26 ENCOUNTER — Ambulatory Visit: Payer: Self-pay

## 2019-08-26 DIAGNOSIS — M79675 Pain in left toe(s): Secondary | ICD-10-CM

## 2019-08-26 DIAGNOSIS — B351 Tinea unguium: Secondary | ICD-10-CM

## 2019-09-02 NOTE — Progress Notes (Signed)
Pt presents with mycotic infection of nails 1-5 bilateral.  All other systems are negative  Laser therapy administered to affected nails and tolerated well. All safety precautions were in place.  2nd treatment.  Follow up in 4 weeks  Late entry requiring signature.  Freddie Breech, DPM

## 2019-09-13 NOTE — Progress Notes (Signed)
Pt presents with mycotic infection of nails 1-5 bilateral.  All other systems are negative  Laser therapy administered to affected nails and tolerated well. All safety precautions were in place.  3rd treatment.  Follow up in 4 weeks

## 2019-09-23 ENCOUNTER — Ambulatory Visit: Payer: Self-pay

## 2019-09-23 ENCOUNTER — Other Ambulatory Visit: Payer: Self-pay

## 2019-09-23 DIAGNOSIS — M79675 Pain in left toe(s): Secondary | ICD-10-CM

## 2019-09-23 DIAGNOSIS — B351 Tinea unguium: Secondary | ICD-10-CM

## 2019-09-23 NOTE — Progress Notes (Signed)
Pt presents with mycotic infection of nails 1-5 bilateral.  All other systems are negative  Laser therapy administered to affected nails and tolerated well. All safety precautions were in place.  4th treatment.  Follow up in 4 weeks

## 2019-09-26 ENCOUNTER — Encounter: Payer: Self-pay | Admitting: Podiatry

## 2019-09-26 ENCOUNTER — Ambulatory Visit (INDEPENDENT_AMBULATORY_CARE_PROVIDER_SITE_OTHER): Payer: 59 | Admitting: Podiatry

## 2019-09-26 ENCOUNTER — Other Ambulatory Visit: Payer: Self-pay

## 2019-09-26 DIAGNOSIS — M79674 Pain in right toe(s): Secondary | ICD-10-CM | POA: Diagnosis not present

## 2019-09-26 DIAGNOSIS — B351 Tinea unguium: Secondary | ICD-10-CM

## 2019-09-26 DIAGNOSIS — E119 Type 2 diabetes mellitus without complications: Secondary | ICD-10-CM | POA: Diagnosis not present

## 2019-09-26 DIAGNOSIS — M79675 Pain in left toe(s): Secondary | ICD-10-CM | POA: Diagnosis not present

## 2019-09-26 DIAGNOSIS — Z794 Long term (current) use of insulin: Secondary | ICD-10-CM

## 2019-09-26 DIAGNOSIS — E118 Type 2 diabetes mellitus with unspecified complications: Secondary | ICD-10-CM | POA: Diagnosis not present

## 2019-09-26 NOTE — Patient Instructions (Signed)
Diabetes Mellitus and Foot Care Foot care is an important part of your health, especially when you have diabetes. Diabetes may cause you to have problems because of poor blood flow (circulation) to your feet and legs, which can cause your skin to:  Become thinner and drier.  Break more easily.  Heal more slowly.  Peel and crack. You may also have nerve damage (neuropathy) in your legs and feet, causing decreased feeling in them. This means that you may not notice minor injuries to your feet that could lead to more serious problems. Noticing and addressing any potential problems early is the best way to prevent future foot problems. How to care for your feet Foot hygiene  Wash your feet daily with warm water and mild soap. Do not use hot water. Then, pat your feet and the areas between your toes until they are completely dry. Do not soak your feet as this can dry your skin.  Trim your toenails straight across. Do not dig under them or around the cuticle. File the edges of your nails with an emery board or nail file.  Apply a moisturizing lotion or petroleum jelly to the skin on your feet and to dry, brittle toenails. Use lotion that does not contain alcohol and is unscented. Do not apply lotion between your toes. Shoes and socks  Wear clean socks or stockings every day. Make sure they are not too tight. Do not wear knee-high stockings since they may decrease blood flow to your legs.  Wear shoes that fit properly and have enough cushioning. Always look in your shoes before you put them on to be sure there are no objects inside.  To break in new shoes, wear them for just a few hours a day. This prevents injuries on your feet. Wounds, scrapes, corns, and calluses  Check your feet daily for blisters, cuts, bruises, sores, and redness. If you cannot see the bottom of your feet, use a mirror or ask someone for help.  Do not cut corns or calluses or try to remove them with medicine.  If you  find a minor scrape, cut, or break in the skin on your feet, keep it and the skin around it clean and dry. You may clean these areas with mild soap and water. Do not clean the area with peroxide, alcohol, or iodine.  If you have a wound, scrape, corn, or callus on your foot, look at it several times a day to make sure it is healing and not infected. Check for: ? Redness, swelling, or pain. ? Fluid or blood. ? Warmth. ? Pus or a bad smell. General instructions  Do not cross your legs. This may decrease blood flow to your feet.  Do not use heating pads or hot water bottles on your feet. They may burn your skin. If you have lost feeling in your feet or legs, you may not know this is happening until it is too late.  Protect your feet from hot and cold by wearing shoes, such as at the beach or on hot pavement.  Schedule a complete foot exam at least once a year (annually) or more often if you have foot problems. If you have foot problems, report any cuts, sores, or bruises to your health care provider immediately. Contact a health care provider if:  You have a medical condition that increases your risk of infection and you have any cuts, sores, or bruises on your feet.  You have an injury that is not   healing.  You have redness on your legs or feet.  You feel burning or tingling in your legs or feet.  You have pain or cramps in your legs and feet.  Your legs or feet are numb.  Your feet always feel cold.  You have pain around a toenail. Get help right away if:  You have a wound, scrape, corn, or callus on your foot and: ? You have pain, swelling, or redness that gets worse. ? You have fluid or blood coming from the wound, scrape, corn, or callus. ? Your wound, scrape, corn, or callus feels warm to the touch. ? You have pus or a bad smell coming from the wound, scrape, corn, or callus. ? You have a fever. ? You have a red line going up your leg. Summary  Check your feet every day  for cuts, sores, red spots, swelling, and blisters.  Moisturize feet and legs daily.  Wear shoes that fit properly and have enough cushioning.  If you have foot problems, report any cuts, sores, or bruises to your health care provider immediately.  Schedule a complete foot exam at least once a year (annually) or more often if you have foot problems. This information is not intended to replace advice given to you by your health care provider. Make sure you discuss any questions you have with your health care provider. Document Released: 11/21/2000 Document Revised: 01/06/2018 Document Reviewed: 12/26/2016 Elsevier Patient Education  2020 Elsevier Inc.  

## 2019-09-29 NOTE — Progress Notes (Signed)
Subjective: Tara Livingston is seen today for follow up painful, elongated, thickened toenails 1-5 b/l feet that she cannot cut. Pain interferes with daily activities. Aggravating factor includes wearing enclosed shoe gear and relieved with periodic debridement.  Current Outpatient Medications on File Prior to Visit  Medication Sig  . aspirin EC 81 MG tablet Take 1 tablet (81 mg total) by mouth every other day.  . DULoxetine (CYMBALTA) 20 MG capsule Take by mouth daily.  . empagliflozin (JARDIANCE) 25 MG TABS tablet Take 25 mg by mouth daily.  . Exenatide ER (BYDUREON BCISE) 2 MG/0.85ML AUIJ Inject 2 mg into the skin once a week.  Marland Kitchen glipiZIDE (GLUCOTROL XL) 10 MG 24 hr tablet Take 10 mg by mouth daily.  . hydrochlorothiazide (HYDRODIURIL) 12.5 MG tablet Take 1 tablet (12.5 mg total) by mouth daily. (Patient not taking: Reported on 02/26/2018)  . Insulin Detemir (LEVEMIR FLEXTOUCH) 100 UNIT/ML Pen Inject 25 Units into the skin daily.  . Insulin Syringes, Disposable, U-100 1 ML MISC Use daily with insulin levimir 17 units  . losartan (COZAAR) 25 MG tablet Take 25 mg by mouth daily.  . metformin (FORTAMET) 1000 MG (OSM) 24 hr tablet Take 2 tablets (2,000 mg total) by mouth daily with breakfast.  . metFORMIN (GLUCOPHAGE) 1000 MG tablet Take 1,000 mg by mouth 2 (two) times daily.  . phentermine (ADIPEX-P) 37.5 MG tablet Take 37.5 mg by mouth daily.  . TRULICITY 6.38 GY/6.5LD SOPN 1 (ONE) SOLUTION PEN INJECTOR WEEKLY  . Vitamin D, Ergocalciferol, (DRISDOL) 50000 units CAPS capsule Take 1 capsule (50,000 Units total) by mouth every 7 (seven) days. (Patient not taking: Reported on 02/26/2018)   No current facility-administered medications on file prior to visit.      Allergies  Allergen Reactions  . Amlodipine Other (See Comments)    Muscle cramps  . Lisinopril Other (See Comments)    Muscle cramps     Objective:  Vascular Examination: Capillary refill time immediate x 10 digits.  Dorsalis  pedis present b/l.  Posterior tibial pulses present b/l.  Digital hair sparse b/l.  Skin temperature gradient WNL b/l.   Dermatological Examination: Skin with normal turgor, texture and tone b/l.  Toenails 1-5 b/l discolored, thick, dystrophic with subungual debris and pain with palpation to nailbeds due to thickness of nails. Incurvated nailplate b/l great toes with tenderness to palpation. No erythema, no edema, no drainage noted.  Musculoskeletal: Muscle strength 5/5 to all LE muscle groups b/l.  No gross bony deformities b/l.  No pain, crepitus or joint limitation noted with ROM.   Neurological Examination: Protective sensation intact 5/5 with 10 gram monofilament bilaterally.  Epicritic sensation present bilaterally.  Vibratory sensation intact bilaterally.   Assessment: Painful onychomycosis toenails 1-5 b/l  NIDDM Encounter for diabetic foot exam  Plan: 1. Toenails 1-5 b/l were debrided in length and girth without iatrogenic bleeding. Offending nail borders debrided and curretaged b/l great toes. Borders cleansed with alcohol. Antibiotic ointment applied. No further treatment required by patient. 2. Patient to continue soft, supportive shoe gear. 3. Patient to report any pedal injuries to medical professional immediately. 4. Follow up 3 months.  5. Patient/POA to call should there be a concern in the interim.

## 2019-10-28 ENCOUNTER — Ambulatory Visit (INDEPENDENT_AMBULATORY_CARE_PROVIDER_SITE_OTHER): Payer: 59 | Admitting: *Deleted

## 2019-10-28 ENCOUNTER — Other Ambulatory Visit: Payer: Self-pay

## 2019-10-28 DIAGNOSIS — M79674 Pain in right toe(s): Secondary | ICD-10-CM

## 2019-10-28 DIAGNOSIS — B351 Tinea unguium: Secondary | ICD-10-CM

## 2019-10-28 DIAGNOSIS — M79675 Pain in left toe(s): Secondary | ICD-10-CM

## 2019-10-28 NOTE — Progress Notes (Signed)
Patient presents today for the 5th laser treatment. Diagnosed with mycotic nail infection by Dr. Elisha Ponder. Toenails most affected were all ten nails. She states they are looking much better.  All other systems are negative.  Nails were filed thin. Laser therapy was administered to 1-5 toenails bilateral and patient tolerated the treatment well. All safety precautions were in place.   Follow up in 4 weeks for laser # 6.

## 2019-11-25 ENCOUNTER — Other Ambulatory Visit: Payer: Self-pay

## 2019-11-25 ENCOUNTER — Ambulatory Visit: Payer: Self-pay | Admitting: *Deleted

## 2019-11-25 DIAGNOSIS — M79674 Pain in right toe(s): Secondary | ICD-10-CM

## 2019-11-25 DIAGNOSIS — M79675 Pain in left toe(s): Secondary | ICD-10-CM

## 2019-11-25 DIAGNOSIS — B351 Tinea unguium: Secondary | ICD-10-CM

## 2019-11-25 NOTE — Progress Notes (Signed)
Patient presents today for the 6th laser treatment. Diagnosed with mycotic nail infection by Dr. Elisha Ponder. Toenail most affected are all ten nails.  All other systems are negative.  Nails were filed thin. Laser therapy was administered to 1-5 toenails bilateral and patient tolerated the treatment well. All safety precautions were in place.   Patient has completed the recommended six laser treatments. I advised she let the toenails grow out over the next 3 months and I will follow up with her then if necessary.

## 2019-12-30 ENCOUNTER — Encounter: Payer: Self-pay | Admitting: Podiatry

## 2019-12-30 ENCOUNTER — Ambulatory Visit (INDEPENDENT_AMBULATORY_CARE_PROVIDER_SITE_OTHER): Payer: No Typology Code available for payment source | Admitting: Podiatry

## 2019-12-30 ENCOUNTER — Other Ambulatory Visit: Payer: Self-pay

## 2019-12-30 DIAGNOSIS — M79675 Pain in left toe(s): Secondary | ICD-10-CM | POA: Diagnosis not present

## 2019-12-30 DIAGNOSIS — M79674 Pain in right toe(s): Secondary | ICD-10-CM

## 2019-12-30 DIAGNOSIS — B351 Tinea unguium: Secondary | ICD-10-CM

## 2019-12-30 NOTE — Patient Instructions (Signed)
Diabetes Mellitus and Foot Care Foot care is an important part of your health, especially when you have diabetes. Diabetes may cause you to have problems because of poor blood flow (circulation) to your feet and legs, which can cause your skin to:  Become thinner and drier.  Break more easily.  Heal more slowly.  Peel and crack. You may also have nerve damage (neuropathy) in your legs and feet, causing decreased feeling in them. This means that you may not notice minor injuries to your feet that could lead to more serious problems. Noticing and addressing any potential problems early is the best way to prevent future foot problems. How to care for your feet Foot hygiene  Wash your feet daily with warm water and mild soap. Do not use hot water. Then, pat your feet and the areas between your toes until they are completely dry. Do not soak your feet as this can dry your skin.  Trim your toenails straight across. Do not dig under them or around the cuticle. File the edges of your nails with an emery board or nail file.  Apply a moisturizing lotion or petroleum jelly to the skin on your feet and to dry, brittle toenails. Use lotion that does not contain alcohol and is unscented. Do not apply lotion between your toes. Shoes and socks  Wear clean socks or stockings every day. Make sure they are not too tight. Do not wear knee-high stockings since they may decrease blood flow to your legs.  Wear shoes that fit properly and have enough cushioning. Always look in your shoes before you put them on to be sure there are no objects inside.  To break in new shoes, wear them for just a few hours a day. This prevents injuries on your feet. Wounds, scrapes, corns, and calluses  Check your feet daily for blisters, cuts, bruises, sores, and redness. If you cannot see the bottom of your feet, use a mirror or ask someone for help.  Do not cut corns or calluses or try to remove them with medicine.  If you  find a minor scrape, cut, or break in the skin on your feet, keep it and the skin around it clean and dry. You may clean these areas with mild soap and water. Do not clean the area with peroxide, alcohol, or iodine.  If you have a wound, scrape, corn, or callus on your foot, look at it several times a day to make sure it is healing and not infected. Check for: ? Redness, swelling, or pain. ? Fluid or blood. ? Warmth. ? Pus or a bad smell. General instructions  Do not cross your legs. This may decrease blood flow to your feet.  Do not use heating pads or hot water bottles on your feet. They may burn your skin. If you have lost feeling in your feet or legs, you may not know this is happening until it is too late.  Protect your feet from hot and cold by wearing shoes, such as at the beach or on hot pavement.  Schedule a complete foot exam at least once a year (annually) or more often if you have foot problems. If you have foot problems, report any cuts, sores, or bruises to your health care provider immediately. Contact a health care provider if:  You have a medical condition that increases your risk of infection and you have any cuts, sores, or bruises on your feet.  You have an injury that is not   healing.  You have redness on your legs or feet.  You feel burning or tingling in your legs or feet.  You have pain or cramps in your legs and feet.  Your legs or feet are numb.  Your feet always feel cold.  You have pain around a toenail. Get help right away if:  You have a wound, scrape, corn, or callus on your foot and: ? You have pain, swelling, or redness that gets worse. ? You have fluid or blood coming from the wound, scrape, corn, or callus. ? Your wound, scrape, corn, or callus feels warm to the touch. ? You have pus or a bad smell coming from the wound, scrape, corn, or callus. ? You have a fever. ? You have a red line going up your leg. Summary  Check your feet every day  for cuts, sores, red spots, swelling, and blisters.  Moisturize feet and legs daily.  Wear shoes that fit properly and have enough cushioning.  If you have foot problems, report any cuts, sores, or bruises to your health care provider immediately.  Schedule a complete foot exam at least once a year (annually) or more often if you have foot problems. This information is not intended to replace advice given to you by your health care provider. Make sure you discuss any questions you have with your health care provider. Document Revised: 08/17/2019 Document Reviewed: 12/26/2016 Elsevier Patient Education  2020 [employer]  

## 2020-01-05 NOTE — Progress Notes (Signed)
Subjective: Tara Livingston presents today referred by Julian Hy, PA-C for complaint of preventative diabetic foot care with and painful mycotic nails b/l that are difficult to trim. Pain interferes with ambulation. Aggravating factors include wearing enclosed shoe gear. Pain is relieved with periodic professional debridement.   Past Medical History:  Diagnosis Date  . Diabetes mellitus without complication ([place])   . Hypertension    doesn't take medication     Patient Active Problem List   Diagnosis Date Noted  . Vitamin D deficiency 01/09/2018  . Class 3 severe obesity due to excess calories with serious comorbidity and body mass index (BMI) of 50.0 to 59.9 in adult ([place]) 01/09/2018  . Medication intolerance 01/09/2018  . Essential hypertension 10/08/2017  . Type 2 diabetes mellitus with complication, with long-term current use of insulin ([place]) 10/08/2017     Past Surgical History:  Procedure Laterality Date  . BREAST SURGERY    . CESAREAN SECTION       Current Outpatient Medications on File Prior to Visit  Medication Sig Dispense Refill  . aspirin EC 81 MG tablet Take 1 tablet (81 mg total) by mouth every other day.    . Diethylpropion HCl CR 75 MG TB24 Take 1 tablet by mouth daily.    . DULoxetine (CYMBALTA) 20 MG capsule Take by mouth daily.    . empagliflozin (JARDIANCE) 25 MG TABS tablet Take 25 mg by mouth daily. 90 tablet 1  . Exenatide ER (BYDUREON BCISE) 2 MG/0.85ML AUIJ Inject 2 mg into the skin once a week. 12 pen 1  . furosemide (LASIX) 20 MG tablet Take 20 mg by mouth 2 (two) times daily.    Marland Kitchen glipiZIDE (GLUCOTROL XL) 10 MG 24 hr tablet Take 10 mg by mouth daily.    . hydrochlorothiazide (HYDRODIURIL) 12.5 MG tablet Take 1 tablet (12.5 mg total) by mouth daily. (Patient not taking: Reported on 02/26/2018) 90 tablet 3  . Insulin Detemir (LEVEMIR FLEXTOUCH) 100 UNIT/ML Pen Inject 25 Units into the skin daily. 10 pen 1  . Insulin Syringes, Disposable, U-100 1  ML MISC Use daily with insulin levimir 17 units 199 each 0  . losartan (COZAAR) 25 MG tablet Take 25 mg by mouth daily.    Marland Kitchen losartan (COZAAR) 50 MG tablet Take 50 mg by mouth daily.    . metformin (FORTAMET) 1000 MG (OSM) 24 hr tablet Take 2 tablets (2,000 mg total) by mouth daily with breakfast. 180 tablet 1  . metFORMIN (GLUCOPHAGE) 1000 MG tablet Take 1,000 mg by mouth 2 (two) times daily.    . phentermine (ADIPEX-P) 37.5 MG tablet Take 37.5 mg by mouth daily.    . TRULICITY 7.40 CX/4.4YJ SOPN 1 (ONE) SOLUTION PEN INJECTOR WEEKLY    . Vitamin D, Ergocalciferol, (DRISDOL) 50000 units CAPS capsule Take 1 capsule (50,000 Units total) by mouth every 7 (seven) days. (Patient not taking: Reported on 02/26/2018) 24 capsule 0   No current facility-administered medications on file prior to visit.     Allergies  Allergen Reactions  . Amlodipine Other (See Comments)    Muscle cramps  . Lisinopril Other (See Comments)    Muscle cramps     Social History   Occupational History  . Not on file  Tobacco Use  . Smoking status: Former Research scientist (life sciences)  . Smokeless tobacco: Never Used  Substance and Sexual Activity  . Alcohol use: Yes  . Drug use: No  . Sexual activity: Yes    Birth control/protection: I.U.D.  History reviewed. No pertinent family history.    There is no immunization history on file for this patient.   Objective: There were no vitals filed for this visit.  Vascular Examination:  Capillary refill time to digits immediate b/l, palpable DP pulses b/l, palpable PT pulses b/l, pedal hair sparse b/l and skin temperature gradient within normal limits b/l  Dermatological Examination: Pedal skin with normal turgor, texture and tone bilaterally, no open wounds bilaterally, no interdigital macerations bilaterally and toenails 1-5 b/l elongated, dystrophic, thickened, crumbly with subungual debris.  There is clearing noted proximal 1/3 of b/l halluces.   Musculoskeletal: Normal  muscle strength 5/5 to all lower extremity muscle groups bilaterally, no gross bony deformities bilaterally and no pain crepitus or joint limitation noted with ROM b/l  Neurological: Protective sensation intact 5/5 intact bilaterally with 10g monofilament b/l  Assessment: 1. Pain due to onychomycosis of toenails of both feet     Plan: -Continue laser therapy for two additional sessions. -Continue diabetic foot care principles. Literature dispensed on today.  -Toenails 1-5 b/l were debrided in length and girth without iatrogenic bleeding. -Patient to continue soft, supportive shoe gear daily. -Patient to report any pedal injuries to medical professional immediately. -Patient/POA to call should there be question/concern in the interim.  Return in about 3 months (around 03/29/2020) for diabetic nail trim.

## 2020-01-06 NOTE — Progress Notes (Signed)
Yes ma'am!

## 2020-01-18 ENCOUNTER — Other Ambulatory Visit: Payer: Self-pay

## 2020-01-18 ENCOUNTER — Ambulatory Visit (HOSPITAL_COMMUNITY)
Admission: EM | Admit: 2020-01-18 | Discharge: 2020-01-18 | Disposition: A | Discharge status: Home / Self Care | Payer: 59 | Attending: Physician Assistant | Admitting: Physician Assistant

## 2020-01-18 ENCOUNTER — Encounter (HOSPITAL_COMMUNITY): Payer: Self-pay

## 2020-01-18 DIAGNOSIS — Z23 Encounter for immunization: Secondary | ICD-10-CM | POA: Diagnosis not present

## 2020-01-18 DIAGNOSIS — S99821A Other specified injuries of right foot, initial encounter: Secondary | ICD-10-CM

## 2020-01-18 DIAGNOSIS — Z1881 Retained glass fragments: Secondary | ICD-10-CM

## 2020-01-18 DIAGNOSIS — I10 Essential (primary) hypertension: Secondary | ICD-10-CM

## 2020-01-18 DIAGNOSIS — W25XXXA Contact with sharp glass, initial encounter: Secondary | ICD-10-CM | POA: Diagnosis not present

## 2020-01-18 DIAGNOSIS — T148XXA Other injury of unspecified body region, initial encounter: Secondary | ICD-10-CM

## 2020-01-18 MED ORDER — BACITRACIN ZINC 500 UNIT/GM EX OINT
TOPICAL_OINTMENT | CUTANEOUS | Status: AC
  Filled 2020-01-18: qty 0.9

## 2020-01-18 MED ORDER — TETANUS-DIPHTH-ACELL PERTUSSIS 5-2.5-18.5 LF-MCG/0.5 IM SUSP
INTRAMUSCULAR | Status: AC
  Filled 2020-01-18: qty 0.5

## 2020-01-18 MED ORDER — TETANUS-DIPHTHERIA TOXOIDS TD 5-2 LFU IM INJ: 0.5000 mL | INJECTION | Freq: Once | INTRAMUSCULAR | Status: DC

## 2020-01-18 MED ORDER — TETANUS-DIPHTH-ACELL PERTUSSIS 5-2.5-18.5 LF-MCG/0.5 IM SUSP
0.5000 mL | Freq: Once | INTRAMUSCULAR | Status: AC
  Administered 2020-01-18: 0.5 mL via INTRAMUSCULAR

## 2020-01-18 NOTE — ED Triage Notes (Signed)
Pt presents with complaints of stepping on glass on Sunday. She thought that she got it all out but noticed yesterday that she still had a piece in there. States the right foot is sore and tender to touch in the heel area. Reports last tetanus shot was either in 2005 or 2010.

## 2020-01-18 NOTE — ED Provider Notes (Addendum)
[place]    CSN: [id number] Arrival date & time: 01/18/20  1429      History   Chief Complaint Chief Complaint  Patient presents with  . Foot Injury    HPI Tara Livingston is a 44 y.o. female.   Patient reports to urgent care today for concern of glass in foot. She reports stepping on broken glass on Sunday with her right heel. She cleaned most of the glass off her foot but noticed over the last 2 days continued pain in her right heel at the location of one of the glass pieces she removed. She has been using bacitracin ointment and bandaids.  She is concerned about possible infections due to her DM. She reports not taking her blood pressure medications today. She denies headache, chest pain, shortness of breath, vision changes.      Past Medical History:  Diagnosis Date  . Diabetes mellitus without complication ([place])   . Hypertension    doesn't take medication    Patient Active Problem List   Diagnosis Date Noted  . Vitamin D deficiency 01/09/2018  . Class 3 severe obesity due to excess calories with serious comorbidity and body mass index (BMI) of 50.0 to 59.9 in adult ([place]) 01/09/2018  . Medication intolerance 01/09/2018  . Essential hypertension 10/08/2017  . Type 2 diabetes mellitus with complication, with long-term current use of insulin ([place]) 10/08/2017    Past Surgical History:  Procedure Laterality Date  . BREAST SURGERY    . CESAREAN SECTION      OB History    Gravida  1   Para  1   Term  1   Preterm      AB      Living  1     SAB      TAB      Ectopic      Multiple      Live Births               Home Medications    Prior to Admission medications   Medication Sig Start Date End Date Taking? Authorizing Provider  aspirin EC 81 MG tablet Take 1 tablet (81 mg total) by mouth every other day. 03/07/17   Shawnee Knapp, MD  Diethylpropion HCl CR 75 MG TB24 Take 1 tablet by mouth daily. 11/02/19   [provider]   DULoxetine (CYMBALTA) 20 MG capsule Take by mouth daily. 01/24/19   [provider]  empagliflozin (JARDIANCE) 25 MG TABS tablet Take 25 mg by mouth daily. 01/09/18   Shawnee Knapp, MD  Exenatide ER (BYDUREON BCISE) 2 MG/0.85ML AUIJ Inject 2 mg into the skin once a week. 01/09/18   Shawnee Knapp, MD  furosemide (LASIX) 20 MG tablet Take 20 mg by mouth 2 (two) times daily. 12/05/19   [provider]  glipiZIDE (GLUCOTROL XL) 10 MG 24 hr tablet Take 10 mg by mouth daily. 09/13/19   [provider]  hydrochlorothiazide (HYDRODIURIL) 12.5 MG tablet Take 1 tablet (12.5 mg total) by mouth daily. Patient not taking: Reported on 02/26/2018 01/08/18   Shawnee Knapp, MD  Insulin Detemir (LEVEMIR FLEXTOUCH) 100 UNIT/ML Pen Inject 25 Units into the skin daily. 01/08/18   Shawnee Knapp, MD  Insulin Syringes, Disposable, U-100 1 ML MISC Use daily with insulin levimir 17 units 03/09/17   Shawnee Knapp, MD  losartan (COZAAR) 25 MG tablet Take 25 mg by mouth daily. 08/18/19   [provider]  losartan (COZAAR) 50 MG tablet Take 50 mg by mouth daily. 11/30/19   [provider]  metformin (FORTAMET) 1000 MG (OSM) 24 hr tablet Take 2 tablets (2,000 mg total) by mouth daily with breakfast. 10/08/17   Shawnee Knapp, MD  metFORMIN (GLUCOPHAGE) 1000 MG tablet Take 1,000 mg by mouth 2 (two) times daily. 08/29/19   [provider]  phentermine (ADIPEX-P) 37.5 MG tablet Take 37.5 mg by mouth daily. 08/18/19   [provider]  TRULICITY 2.29 NL/8.9QJ SOPN 1 (ONE) SOLUTION PEN INJECTOR WEEKLY 09/01/19   [provider]  Vitamin D, Ergocalciferol, (DRISDOL) 50000 units CAPS capsule Take 1 capsule (50,000 Units total) by mouth every 7 (seven) days. Patient not taking: Reported on 02/26/2018 01/09/18   Shawnee Knapp, MD    Family History Family History  Problem Relation Age of Onset  . Diabetes Mother   . Hypertension Mother   . Healthy Father     Social History Social History    Tobacco Use  . Smoking status: Former Research scientist (life sciences)  . Smokeless tobacco: Never Used  Substance Use Topics  . Alcohol use: Yes  . Drug use: No     Allergies   Amlodipine and Lisinopril   Review of Systems Review of Systems  Constitutional: Negative for chills and fever.  Respiratory: Negative for shortness of breath.   Cardiovascular: Negative for chest pain, palpitations and leg swelling.  Musculoskeletal: Positive for gait problem.  Skin: Positive for wound.  Neurological: Negative for weakness, light-headedness, numbness and headaches.  All other systems reviewed and are negative.    Physical Exam Triage Vital Signs ED Triage Vitals  Enc Vitals Group     BP 01/18/20 1527 (!) 210/98     Pulse Rate 01/18/20 1527 82     Resp 01/18/20 1527 18     Temp 01/18/20 1527 98 F (36.7 C)     Temp src --      SpO2 01/18/20 1527 95 %     Weight --      Height --      Head Circumference --      Peak Flow --      Pain Score 01/18/20 1525 1     Pain Loc --      Pain Edu? --      Excl. in [place]? --    No data found.  Updated Vital Signs BP (!) 210/98   Pulse 82   Temp 98 F (36.7 C)   Resp 18   SpO2 95%   Visual Acuity Right Eye Distance:   Left Eye Distance:   Bilateral Distance:    Right Eye Near:   Left Eye Near:    Bilateral Near:     Physical Exam Vitals and nursing note reviewed.  Constitutional:      General: She is not in acute distress.    Appearance: She is well-developed.  HENT:     Head: Normocephalic and atraumatic.  Eyes:     Conjunctiva/sclera: Conjunctivae normal.  Cardiovascular:     Rate and Rhythm: Normal rate.  Pulmonary:     Effort: Pulmonary effort is normal. No respiratory distress.  Musculoskeletal:        General: Normal range of motion.     Cervical back: Neck supple.  Skin:    General: Skin is warm and dry.     Comments: Small wound with  ~11m long shard of glass present on right heel. Removed with forceps.  No sign of infection   Neurological:     General: No focal deficit present.     Mental Status: She is alert and oriented to person, place, and time.  Psychiatric:        Mood and Affect: Mood normal.        Behavior: Behavior normal.        Thought Content: Thought content normal.        Judgment: Judgment normal.      UC Treatments / Results  Labs (all labs ordered are listed, but only abnormal results are displayed) Labs Reviewed - No data to display  EKG   Radiology No results found.  Procedures Foreign Body Removal  Date/Time: 01/18/2020 5:05 PM Performed by: Purnell Shoemaker, PA-C Authorized by: Purnell Shoemaker, PA-C   Consent:    Consent obtained:  Verbal   Consent given by:  Patient   Risks discussed:  Bleeding, infection and pain   Alternatives discussed:  No treatment Location:    Location:  Foot   Foot location:  R heel   Depth:  Intradermal   Tendon involvement:  None Pre-procedure details:    Imaging:  None Anesthesia (see MAR for exact dosages):    Anesthesia method:  None Procedure type:    Procedure complexity:  Simple Procedure details:    Foreign bodies recovered:  1   Description:  Glass shard   Intact foreign body removal: yes   Post-procedure details:    Skin closure:  None   Patient tolerance of procedure:  Tolerated well, no immediate complications Comments:     Removed with forceps   (including critical care time)  Medications Ordered in UC Medications  Tdap (BOOSTRIX) injection 0.5 mL (0.5 mLs Intramuscular Given 01/18/20 1629)    Initial Impression / Assessment and Plan / UC Course  I have reviewed the triage vital signs and the nursing notes.  Pertinent labs & imaging results that were available during my care of the patient were reviewed by me and considered in my medical decision making (see chart for details).     #Glass shard in foot #Elevated BP - Removed with forceps. Bacitracin applied and bandaid. Wound care instructions given. - TDAP  updated - patient on losartan, did not take medication today. No symptoms, she will restart and follow up with PCP as needed   Final Clinical Impressions(s) / UC Diagnoses   Final diagnoses:  Glass foreign body in skin  Elevated blood pressure reading in office with diagnosis of hypertension     Discharge Instructions     Utilize bacitracin ointment and bandaids until the wound heals  Take your blood pressure medications and monitor your blood pressure. Follow up with your primary care for further management.  Go directly to the Emergency Department or call 911 if you have severe chest pain, shortness of breath, severe worst headache of your life.      ED Prescriptions    None     PDMP not reviewed this encounter.   Purnell Shoemaker, PA-C 01/18/20 1707    Jaymari Cromie, Marguerita Beards, PA-C 01/18/20 1708    Jehiel Koepp, Marguerita Beards, PA-C 01/18/20 1754

## 2020-01-18 NOTE — Discharge Instructions (Signed)
Utilize bacitracin ointment and bandaids until the wound heals  Take your blood pressure medications and monitor your blood pressure. Follow up with your primary care for further management.  Go directly to the Emergency Department or call 911 if you have severe chest pain, shortness of breath, severe worst headache of your life.

## 2020-01-27 ENCOUNTER — Other Ambulatory Visit: Payer: Self-pay

## 2020-01-27 ENCOUNTER — Ambulatory Visit (INDEPENDENT_AMBULATORY_CARE_PROVIDER_SITE_OTHER): Payer: 59 | Admitting: *Deleted

## 2020-01-27 DIAGNOSIS — B351 Tinea unguium: Secondary | ICD-10-CM

## 2020-01-27 DIAGNOSIS — M79674 Pain in right toe(s): Secondary | ICD-10-CM

## 2020-01-27 DIAGNOSIS — M79675 Pain in left toe(s): Secondary | ICD-10-CM

## 2020-01-27 NOTE — Progress Notes (Signed)
Patient presents today for the 7th laser treatment. Diagnosed with mycotic nail infection by Dr. Elisha Ponder. Toenail most affected are all ten nails.  All other systems are negative.  Nails were filed thin. Laser therapy was administered to 1-5 toenails bilateral and patient tolerated the treatment well. All safety precautions were in place.   I will follow up with her for one more laser in 4 weeks.  Dr. Elisha Ponder wanted patient to have 2 more laser treatments when she saw her on 12/30/19.

## 2020-02-24 ENCOUNTER — Other Ambulatory Visit: Payer: Self-pay

## 2020-02-24 ENCOUNTER — Ambulatory Visit (INDEPENDENT_AMBULATORY_CARE_PROVIDER_SITE_OTHER): Payer: 59 | Admitting: *Deleted

## 2020-02-24 DIAGNOSIS — B351 Tinea unguium: Secondary | ICD-10-CM

## 2020-02-24 DIAGNOSIS — M79675 Pain in left toe(s): Secondary | ICD-10-CM

## 2020-02-24 DIAGNOSIS — M79674 Pain in right toe(s): Secondary | ICD-10-CM

## 2020-02-24 NOTE — Progress Notes (Signed)
Patient presents today for the 8th laser treatment. Diagnosed with mycotic nail infection by Dr. Elisha Ponder.  All ten toenails are affected. She is pleased with the way her nails look now.  All other systems are negative.  Nails were filed thin. Laser therapy was administered to 1-5 toenails bilateral and patient tolerated the treatment well. All safety precautions were in place.   Patient has completed the recommended eight laser treatments per Dr. Elisha Ponder. She will follow up with her on her regularly scheduled visit in April.

## 2020-03-30 ENCOUNTER — Other Ambulatory Visit: Payer: Self-pay

## 2020-03-30 ENCOUNTER — Ambulatory Visit (INDEPENDENT_AMBULATORY_CARE_PROVIDER_SITE_OTHER): Payer: 59 | Admitting: Podiatry

## 2020-03-30 VITALS — Temp 98.1°F

## 2020-03-30 DIAGNOSIS — B351 Tinea unguium: Secondary | ICD-10-CM

## 2020-03-30 DIAGNOSIS — M79675 Pain in left toe(s): Secondary | ICD-10-CM

## 2020-03-30 DIAGNOSIS — M79674 Pain in right toe(s): Secondary | ICD-10-CM

## 2020-03-30 DIAGNOSIS — E118 Type 2 diabetes mellitus with unspecified complications: Secondary | ICD-10-CM | POA: Diagnosis not present

## 2020-03-30 DIAGNOSIS — Z794 Long term (current) use of insulin: Secondary | ICD-10-CM

## 2020-03-30 NOTE — Patient Instructions (Signed)
Diabetes Mellitus and Foot Care Foot care is an important part of your health, especially when you have diabetes. Diabetes may cause you to have problems because of poor blood flow (circulation) to your feet and legs, which can cause your skin to:  Become thinner and drier.  Break more easily.  Heal more slowly.  Peel and crack. You may also have nerve damage (neuropathy) in your legs and feet, causing decreased feeling in them. This means that you may not notice minor injuries to your feet that could lead to more serious problems. Noticing and addressing any potential problems early is the best way to prevent future foot problems. How to care for your feet Foot hygiene  Wash your feet daily with warm water and mild soap. Do not use hot water. Then, pat your feet and the areas between your toes until they are completely dry. Do not soak your feet as this can dry your skin.  Trim your toenails straight across. Do not dig under them or around the cuticle. File the edges of your nails with an emery board or nail file.  Apply a moisturizing lotion or petroleum jelly to the skin on your feet and to dry, brittle toenails. Use lotion that does not contain alcohol and is unscented. Do not apply lotion between your toes. Shoes and socks  Wear clean socks or stockings every day. Make sure they are not too tight. Do not wear knee-high stockings since they may decrease blood flow to your legs.  Wear shoes that fit properly and have enough cushioning. Always look in your shoes before you put them on to be sure there are no objects inside.  To break in new shoes, wear them for just a few hours a day. This prevents injuries on your feet. Wounds, scrapes, corns, and calluses  Check your feet daily for blisters, cuts, bruises, sores, and redness. If you cannot see the bottom of your feet, use a mirror or ask someone for help.  Do not cut corns or calluses or try to remove them with medicine.  If you  find a minor scrape, cut, or break in the skin on your feet, keep it and the skin around it clean and dry. You may clean these areas with mild soap and water. Do not clean the area with peroxide, alcohol, or iodine.  If you have a wound, scrape, corn, or callus on your foot, look at it several times a day to make sure it is healing and not infected. Check for: ? Redness, swelling, or pain. ? Fluid or blood. ? Warmth. ? Pus or a bad smell. General instructions  Do not cross your legs. This may decrease blood flow to your feet.  Do not use heating pads or hot water bottles on your feet. They may burn your skin. If you have lost feeling in your feet or legs, you may not know this is happening until it is too late.  Protect your feet from hot and cold by wearing shoes, such as at the beach or on hot pavement.  Schedule a complete foot exam at least once a year (annually) or more often if you have foot problems. If you have foot problems, report any cuts, sores, or bruises to your health care provider immediately. Contact a health care provider if:  You have a medical condition that increases your risk of infection and you have any cuts, sores, or bruises on your feet.  You have an injury that is not   healing.  You have redness on your legs or feet.  You feel burning or tingling in your legs or feet.  You have pain or cramps in your legs and feet.  Your legs or feet are numb.  Your feet always feel cold.  You have pain around a toenail. Get help right away if:  You have a wound, scrape, corn, or callus on your foot and: ? You have pain, swelling, or redness that gets worse. ? You have fluid or blood coming from the wound, scrape, corn, or callus. ? Your wound, scrape, corn, or callus feels warm to the touch. ? You have pus or a bad smell coming from the wound, scrape, corn, or callus. ? You have a fever. ? You have a red line going up your leg. Summary  Check your feet every day  for cuts, sores, red spots, swelling, and blisters.  Moisturize feet and legs daily.  Wear shoes that fit properly and have enough cushioning.  If you have foot problems, report any cuts, sores, or bruises to your health care provider immediately.  Schedule a complete foot exam at least once a year (annually) or more often if you have foot problems. This information is not intended to replace advice given to you by your health care provider. Make sure you discuss any questions you have with your health care provider. Document Revised: 08/17/2019 Document Reviewed: 12/26/2016 Elsevier Patient Education  [place]? An infection that lies within the keratin of your nail plate that is caused by a fungus.  WHY ME? Fungal infections affect all ages, sexes, races, and creeds.  There may be many factors that predispose you to a fungal infection such as age, coexisting medical conditions such as diabetes, or an autoimmune disease; stress, medications, fatigue, genetics, etc.  Bottom line: fungus thrives in a warm, moist environment and your shoes offer such a location.  IS IT CONTAGIOUS? Theoretically, yes.  You do not want to share shoes, nail clippers or files with someone who has fungal toenails.  Walking around barefoot in the same room or sleeping in the same bed is unlikely to transfer the organism.  It is important to realize, however, that fungus can spread easily from one nail to the next on the same foot.  HOW DO WE TREAT THIS?  There are several ways to treat this condition.  Treatment may depend on many factors such as age, medications, pregnancy, liver and kidney conditions, etc.  It is best to ask your doctor which options are available to you.  5. No treatment.   Unlike many other medical concerns, you can live with this condition.  However for many people this can be a painful condition and may lead to ingrown toenails or a bacterial  infection.  It is recommended that you keep the nails cut short to help reduce the amount of fungal nail. 6. Topical treatment.  These range from herbal remedies to prescription strength nail lacquers.  About 40-50% effective, topicals require twice daily application for approximately 9 to 12 months or until an entirely new nail has grown out.  The most effective topicals are medical grade medications available through physicians offices. 7. Oral antifungal medications.  With an 80-90% cure rate, the most common oral medication requires 3 to 4 months of therapy and stays in your system for a year as the new nail grows out.  Oral antifungal medications do require blood work to make  sure it is a safe drug for you.  A liver function panel will be performed prior to starting the medication and after the first month of treatment.  It is important to have the blood work performed to avoid any harmful side effects.  In general, this medication safe but blood work is required. 8. Laser Therapy.  This treatment is performed by applying a specialized laser to the affected nail plate.  This therapy is noninvasive, fast, and non-painful.  It is not covered by insurance and is therefore, out of pocket.  The results have been very good with a 80-95% cure rate.  [facility] is the only practice in the area to offer this therapy. 9. Permanent Nail Avulsion.  Removing the entire nail so that a new nail will not grow back.

## 2020-03-30 NOTE — Progress Notes (Signed)
Subjective: Tara Livingston presents today for follow up of painful mycotic nails b/l. She has completed laser therapy and notes improvement in appearance of nails. She feels her great toenails are thinner and lighter now.  Allergies  Allergen Reactions  . Amlodipine Other (See Comments)    Muscle cramps  . Lisinopril Other (See Comments)    Muscle cramps    Objective: Vitals:   03/30/20 0928  Temp: 98.1 F (36.7 C)   Pt is a pleasant  44 y.o. year old AA female  in NAD. AAO x 3.   Vascular Examination:  Capillary refill time to digits immediate b/l. Palpable DP pulses b/l. Palpable PT pulses b/l. Pedal hair sparse b/l. Skin temperature gradient within normal limits b/l.  Dermatological Examination: Pedal skin with normal turgor, texture and tone bilaterally. No open wounds bilaterally. No interdigital macerations bilaterally. Toenails 1-5 b/l elongated, thickened. She has improvement in appearance of nails. Distal 1/3 of nailplates with residual fungal elements.   Incurvated nailplate b/l great toes with tenderness to palpation. No erythema, no edema, no drainage noted.  Musculoskeletal: Normal muscle strength 5/5 to all lower extremity muscle groups bilaterally, no gross bony deformities bilaterally, no pain crepitus or joint limitation noted with ROM b/l and patient ambulates independent of any assistive aids.  Neurological: Protective sensation intact 5/5 intact bilaterally with 10g monofilament b/l. Vibratory sensation intact b/l. Babinski reflex negative b/l. Achilles reflex 2+ b/. Clonus negative b/l.  Assessment: 1. Pain due to onychomycosis of toenails of both feet   2. Type 2 diabetes mellitus with complication, with long-term current use of insulin (HCC)    Plan: -Toenails 1-5 b/l were debrided in length and girth with sterile nail nippers and dremel without iatrogenic bleeding. Offending nail borders debrided and curretaged b/l great toes. Borders cleansed with  alcohol. Antibiotic ointment applied. No further treatment required by patient. Will reassess nails in 6 months. She would like to get a pedicure and we discussed her taking her own instruments and nail polish. She related understanding. -Patient to continue soft, supportive shoe gear daily. -Patient to report any pedal injuries to medical professional immediately. -Patient/POA to call should there be question/concern in the interim.  Return in about 6 months (around 09/29/2020).

## 2020-04-01 ENCOUNTER — Encounter: Payer: Self-pay | Admitting: Podiatry

## 2020-10-01 ENCOUNTER — Ambulatory Visit (INDEPENDENT_AMBULATORY_CARE_PROVIDER_SITE_OTHER): Payer: No Typology Code available for payment source | Admitting: Podiatry

## 2020-10-01 ENCOUNTER — Encounter: Payer: Self-pay | Admitting: Podiatry

## 2020-10-01 ENCOUNTER — Other Ambulatory Visit: Payer: Self-pay

## 2020-10-01 DIAGNOSIS — E118 Type 2 diabetes mellitus with unspecified complications: Secondary | ICD-10-CM

## 2020-10-01 DIAGNOSIS — Z794 Long term (current) use of insulin: Secondary | ICD-10-CM | POA: Diagnosis not present

## 2020-10-01 DIAGNOSIS — E119 Type 2 diabetes mellitus without complications: Secondary | ICD-10-CM

## 2020-10-01 DIAGNOSIS — M79675 Pain in left toe(s): Secondary | ICD-10-CM | POA: Diagnosis not present

## 2020-10-01 DIAGNOSIS — B351 Tinea unguium: Secondary | ICD-10-CM

## 2020-10-01 DIAGNOSIS — M79674 Pain in right toe(s): Secondary | ICD-10-CM

## 2020-10-06 NOTE — Progress Notes (Signed)
ANNUAL DIABETIC FOOT EXAM  Subjective: Tara Livingston presents today for preventative diabetic foot care, for annual diabetic foot examination and painful thick toenails that are difficult to trim. Pain interferes with ambulation. Aggravating factors include wearing enclosed shoe gear. Pain is relieved with periodic professional debridement..  Patient denies h/o foot wounds. She has undergone laser treatment of mycotic toenails.  Tara Hy, PA-C is patient's PCP.   Past Medical History:  Diagnosis Date   Diabetes mellitus without complication ([place])    Hypertension    doesn't take medication    Patient Active Problem List   Diagnosis Date Noted   Vitamin D deficiency 01/09/2018   Class 3 severe obesity due to excess calories with serious comorbidity and body mass index (BMI) of 50.0 to 59.9 in adult [facility]) 01/09/2018   Medication intolerance 01/09/2018   Essential hypertension 10/08/2017   Type 2 diabetes mellitus with complication, with long-term current use of insulin ([place]) 10/08/2017    Past Surgical History:  Procedure Laterality Date   BREAST SURGERY     CESAREAN SECTION      Current Outpatient Medications on File Prior to Visit  Medication Sig Dispense Refill   aspirin EC 81 MG tablet Take 1 tablet (81 mg total) by mouth every other day.     Diethylpropion HCl CR 75 MG TB24 Take 1 tablet by mouth daily.     DULoxetine (CYMBALTA) 20 MG capsule Take by mouth daily.     empagliflozin (JARDIANCE) 25 MG TABS tablet Take 25 mg by mouth daily. 90 tablet 1   Exenatide ER (BYDUREON BCISE) 2 MG/0.85ML AUIJ Inject 2 mg into the skin once a week. 12 pen 1   furosemide (LASIX) 20 MG tablet Take 20 mg by mouth 2 (two) times daily.     glipiZIDE (GLUCOTROL XL) 10 MG 24 hr tablet Take 10 mg by mouth daily.     hydrochlorothiazide (HYDRODIURIL) 12.5 MG tablet Take 1 tablet (12.5 mg total) by mouth daily. (Patient not taking: Reported on 02/26/2018) 90 tablet 3    Insulin Detemir (LEVEMIR FLEXTOUCH) 100 UNIT/ML Pen Inject 25 Units into the skin daily. 10 pen 1   Insulin Syringes, Disposable, U-100 1 ML MISC Use daily with insulin levimir 17 units 199 each 0   losartan (COZAAR) 100 MG tablet Take 100 mg by mouth daily.     losartan (COZAAR) 25 MG tablet Take 25 mg by mouth daily.     losartan (COZAAR) 50 MG tablet Take 50 mg by mouth daily.     metformin (FORTAMET) 1000 MG (OSM) 24 hr tablet Take 2 tablets (2,000 mg total) by mouth daily with breakfast. 180 tablet 1   metFORMIN (GLUCOPHAGE) 1000 MG tablet Take 1,000 mg by mouth 2 (two) times daily.     NIFEdipine (ADALAT CC) 30 MG 24 hr tablet Take 30 mg by mouth daily.     phentermine (ADIPEX-P) 37.5 MG tablet Take 37.5 mg by mouth daily.     topiramate (TOPAMAX) 50 MG tablet Take 50 mg by mouth daily.     TRULICITY 7.82 NF/6.2ZH SOPN 1 (ONE) SOLUTION PEN INJECTOR WEEKLY     Vitamin D, Ergocalciferol, (DRISDOL) 50000 units CAPS capsule Take 1 capsule (50,000 Units total) by mouth every 7 (seven) days. (Patient not taking: Reported on 02/26/2018) 24 capsule 0   No current facility-administered medications on file prior to visit.     Allergies  Allergen Reactions   Amlodipine Other (See Comments)    Muscle cramps  Lisinopril Other (See Comments)    Muscle cramps    Social History   Occupational History   Not on file  Tobacco Use   Smoking status: Former Smoker   Smokeless tobacco: Never Used  Substance and Sexual Activity   Alcohol use: Yes   Drug use: No   Sexual activity: Yes    Birth control/protection: I.U.D.    Family History  Problem Relation Age of Onset   Diabetes Mother    Hypertension Mother    Healthy Father     Immunization History  Administered Date(s) Administered   Tdap 01/18/2020     Objective: There were no vitals filed for this visit.  Tara Livingston is a pleasant 44 y.o. African American female in NAD. AAO X 3.  Vascular  Examination: Capillary refill time to digits immediate b/l. Palpable pedal pulses b/l LE. Pedal hair sparse. Lower extremity skin temperature gradient within normal limits. No pain with calf compression b/l.  Dermatological Examination: Pedal skin with normal turgor, texture and tone bilaterally. No open wounds bilaterally. No interdigital macerations bilaterally. Toenails 1-5 b/l elongated, discolored, dystrophic, thickened, crumbly with subungual debris and tenderness to dorsal palpation. Distal 1/5 of nailplates with residual fungal elements.  Musculoskeletal Examination: Normal muscle strength 5/5 to all lower extremity muscle groups bilaterally. No pain crepitus or joint limitation noted with ROM b/l. No gross bony deformities bilaterally. Patient ambulates independent of any assistive aids.  Footwear Assessment: Does the patient wear appropriate shoes? Yes.. Does the patient need inserts/orthotics? No.  Neurological Examination: Protective sensation intact 5/5 intact bilaterally with 10g monofilament b/l. Vibratory sensation intact b/l. Proprioception intact bilaterally.  Assessment: 1. Pain due to onychomycosis of toenails of both feet   2. Type 2 diabetes mellitus with complication, with long-term current use of insulin ([place])   3. Encounter for diabetic foot exam ([place])      ADA Risk Categorization: Low Risk :  Patient has all of the following: Intact protective sensation No prior foot ulcer  No severe deformity Pedal pulses present  Plan: -Examined patient. -Diabetic foot examination performed on today's visit. -Continue diabetic foot care principles. -Toenails 1-5 b/l were debrided in length and girth with sterile nail nippers and dremel without iatrogenic bleeding.  -Patient to report any pedal injuries to medical professional immediately. -Patient to continue soft, supportive shoe gear daily. -Patient/POA to call should there be question/concern in the  interim.  Return in about 3 months (around 01/01/2021) for diabetic toenails.  Tara Livingston, DPM

## 2021-01-04 ENCOUNTER — Ambulatory Visit (INDEPENDENT_AMBULATORY_CARE_PROVIDER_SITE_OTHER): Payer: No Typology Code available for payment source | Admitting: Podiatry

## 2021-01-04 ENCOUNTER — Encounter: Payer: Self-pay | Admitting: Podiatry

## 2021-01-04 ENCOUNTER — Other Ambulatory Visit: Payer: Self-pay

## 2021-01-04 DIAGNOSIS — B351 Tinea unguium: Secondary | ICD-10-CM

## 2021-01-04 DIAGNOSIS — M79674 Pain in right toe(s): Secondary | ICD-10-CM | POA: Diagnosis not present

## 2021-01-04 DIAGNOSIS — Z794 Long term (current) use of insulin: Secondary | ICD-10-CM

## 2021-01-04 DIAGNOSIS — E118 Type 2 diabetes mellitus with unspecified complications: Secondary | ICD-10-CM

## 2021-01-04 DIAGNOSIS — K589 Irritable bowel syndrome without diarrhea: Secondary | ICD-10-CM | POA: Insufficient documentation

## 2021-01-04 DIAGNOSIS — M79675 Pain in left toe(s): Secondary | ICD-10-CM | POA: Diagnosis not present

## 2021-01-06 NOTE — Progress Notes (Signed)
Subjective: Tara Livingston presents today for follow up of painful mycotic nails b/l. She has completed laser therapy and notes improvement in appearance of nails.   PCP: Julian Hy, PA-C, is her PCP.   Allergies  Allergen Reactions  . Amlodipine Other (See Comments)    Muscle cramps  . Lisinopril Other (See Comments)    Muscle cramps    Objective: There were no vitals filed for this visit. Pt is a pleasant  45 y.o. year old AA female  in NAD. AAO x 3.   Vascular Examination:  Capillary refill time to digits immediate b/l. Palpable DP pulses b/l. Palpable PT pulses b/l. Pedal hair sparse b/l. Skin temperature gradient within normal limits b/l.  Dermatological Examination: Pedal skin with normal turgor, texture and tone bilaterally. No open wounds bilaterally. No interdigital macerations bilaterally. Toenails 1-5 b/l elongated, thickened. She has improvement in appearance of nails. Distal 1/3 of nailplates with residual fungal elements.   Incurvated nailplate b/l great toes with tenderness to palpation. No erythema, no edema, no drainage noted.  Musculoskeletal: Normal muscle strength 5/5 to all lower extremity muscle groups bilaterally, no gross bony deformities bilaterally, no pain crepitus or joint limitation noted with ROM b/l and patient ambulates independent of any assistive aids.  Neurological: Protective sensation intact 5/5 intact bilaterally with 10g monofilament b/l. Vibratory sensation intact b/l. Babinski reflex negative b/l. Achilles reflex 2+ b/. Clonus negative b/l.  Assessment: 1. Pain due to onychomycosis of toenails of both feet   2. Type 2 diabetes mellitus with complication, with long-term current use of insulin (HCC)    Plan: -No new orders. -Toenails 1-5 b/l were debrided in length and girth with sterile nail nippers and dremel without iatrogenic bleeding.   -Patient to continue soft, supportive shoe gear daily. -Patient to report any pedal  injuries to medical professional immediately. -Patient/POA to call should there be question/concern in the interim.  Return in about 3 months (around 04/04/2021).

## 2021-04-19 ENCOUNTER — Other Ambulatory Visit: Payer: Self-pay

## 2021-04-19 ENCOUNTER — Encounter: Payer: Self-pay | Admitting: Podiatry

## 2021-04-19 ENCOUNTER — Ambulatory Visit (INDEPENDENT_AMBULATORY_CARE_PROVIDER_SITE_OTHER): Payer: No Typology Code available for payment source | Admitting: Podiatry

## 2021-04-19 DIAGNOSIS — B351 Tinea unguium: Secondary | ICD-10-CM

## 2021-04-19 DIAGNOSIS — Z794 Long term (current) use of insulin: Secondary | ICD-10-CM

## 2021-04-19 DIAGNOSIS — M79675 Pain in left toe(s): Secondary | ICD-10-CM

## 2021-04-19 DIAGNOSIS — E118 Type 2 diabetes mellitus with unspecified complications: Secondary | ICD-10-CM

## 2021-04-19 DIAGNOSIS — M79674 Pain in right toe(s): Secondary | ICD-10-CM

## 2021-04-25 NOTE — Progress Notes (Signed)
  Subjective:  Patient ID: Tara Livingston, female    DOB: [birth year],  MRN: [id number]  45 y.o. female presents with preventative diabetic foot care and painful thick toenails that are difficult to trim. Pain interferes with ambulation. Aggravating factors include wearing enclosed shoe gear. Pain is relieved with periodic professional debridement..    Patient's blood sugar was 190 mg/dl yesterday morning.  PCP: Julian Hy, PA-C and last visit was: 04/06/2021.  Review of Systems: Negative except as noted in the HPI.   Allergies  Allergen Reactions  . Amlodipine Other (See Comments)    Muscle cramps  . Lisinopril Other (See Comments)    Muscle cramps    Objective:  There were no vitals filed for this visit. Constitutional Patient is a pleasant 45 y.o. African American female morbidly obese in NAD. AAO x 3.  Vascular Capillary fill time to digits <3 seconds b/l lower extremities. Palpable pedal pulses b/l LE. Pedal hair sparse. Lower extremity skin temperature gradient within normal limits. No pain with calf compression b/l. No cyanosis or clubbing noted.  Neurologic Normal speech. Protective sensation intact 5/5 intact bilaterally with 10g monofilament b/l. Vibratory sensation intact b/l.  Dermatologic Pedal skin with normal turgor, texture and tone bilaterally. No open wounds bilaterally. No interdigital macerations bilaterally. Toenails 1-5 b/l elongated, discolored, dystrophic, thickened, crumbly with subungual debris and tenderness to dorsal palpation. Incurvated nailplate b/l border(s) L hallux and R hallux.  Nail border hypertrophy absent. There is tenderness to palpation. Sign(s) of infection: no clinical signs of infection noted on examination today..  Orthopedic: Normal muscle strength 5/5 to all lower extremity muscle groups bilaterally. No pain crepitus or joint limitation noted with ROM b/l. No gross bony deformities bilaterally.   No flowsheet data found.      Assessment:   1. Pain due to onychomycosis of toenails of both feet   2. Type 2 diabetes mellitus with complication, with long-term current use of insulin ([place])    Plan:  Patient was evaluated and treated and all questions answered.  Onychomycosis with pain -Nails palliatively debridement as below. -Educated on self-care  Procedure: Nail Debridement Rationale: Pain Type of Debridement: manual, sharp debridement. Instrumentation: Nail nipper, rotary burr. Number of Nails: 10  -Examined patient. -Continue diabetic foot care principles. -Patient to continue soft, supportive shoe gear daily. -Toenails 1-5 b/l were debrided in length and girth with sterile nail nippers and dremel without iatrogenic bleeding.  -Offending nail border debrided and curretaged L hallux and R hallux utilizing sterile nail nipper and currette. Border cleansed with alcohol and triple antibiotic applied. No further treatment required by patient/caregiver. -Patient to report any pedal injuries to medical professional immediately. -Patient/POA to call should there be question/concern in the interim.  Return in about 3 months (around 07/20/2021).  Marzetta Board, DPM

## 2021-07-26 ENCOUNTER — Ambulatory Visit: Payer: No Typology Code available for payment source | Admitting: Podiatry

## 2021-08-20 ENCOUNTER — Other Ambulatory Visit: Payer: Self-pay

## 2021-08-20 ENCOUNTER — Ambulatory Visit (INDEPENDENT_AMBULATORY_CARE_PROVIDER_SITE_OTHER): Payer: No Typology Code available for payment source | Admitting: Podiatry

## 2021-08-20 DIAGNOSIS — L600 Ingrowing nail: Secondary | ICD-10-CM

## 2021-08-20 DIAGNOSIS — Z794 Long term (current) use of insulin: Secondary | ICD-10-CM

## 2021-08-20 DIAGNOSIS — E118 Type 2 diabetes mellitus with unspecified complications: Secondary | ICD-10-CM | POA: Diagnosis not present

## 2021-08-25 ENCOUNTER — Encounter: Payer: Self-pay | Admitting: Podiatry

## 2021-08-25 NOTE — Progress Notes (Signed)
  Subjective:  Patient ID: Tara Livingston, female    DOB: November 18, 1976,  MRN: [id number]  45 y.o. female presents with  elongated incurvated toenails b/l and preventative diabetic foot care.    Patient's blood sugar was 136 mg/dl today.   PCP: Julian Hy, PA-C and last visit was: 04/06/2021.  Review of Systems: Negative except as noted in the HPI.   Allergies  Allergen Reactions   Statins     Other reaction(s): Myalgias (Muscle Pain)   Amlodipine Other (See Comments)    Muscle cramps   Lisinopril Other (See Comments)    Muscle cramps    Objective:  There were no vitals filed for this visit. Constitutional Patient is a pleasant 45 y.o. African American female morbidly obese in NAD. AAO x 3.  Vascular Capillary fill time to digits <3 seconds b/l lower extremities. Palpable DP pulse(s) b/l lower extremities Palpable PT pulse(s) b/l lower extremities Pedal hair sparse. Lower extremity skin temperature gradient within normal limits. No pain with calf compression b/l.  Neurologic Protective sensation intact 5/5 intact bilaterally with 10g monofilament b/l. Vibratory sensation intact b/l. No clonus b/l. Protective sensation intact 5/5 intact bilaterally with 10g monofilament b/l. Vibratory sensation intact b/l.  Dermatologic Pedal skin is warm and supple b/l.  No open wounds b/l lower extremities. No interdigital macerations b/l lower extremities. Nondystrophic toenails 1-5 bilaterally. Incurvated nailplate bilateral border(s) L hallux and R hallux.  Nail border hypertrophy absent. There is tenderness to palpation. Sign(s) of infection: no clinical signs of infection noted on examination today..  Orthopedic: Patient ambulates independent of any assistive aids. Normal muscle strength 5/5 to all lower extremity muscle groups bilaterally. No gross bony deformities b/l lower extremities.    Assessment:   1. Ingrown toenail without infection   2. Type 2 diabetes mellitus with complication,  with long-term current use of insulin ([place])    Plan:  Patient was evaluated and treated and all questions answered. Consent given for treatment as described below: -Examined patient. -Patient to continue soft, supportive shoe gear daily. -Offending nail border debrided and curretaged L hallux and R hallux utilizing sterile nail nipper and currette. Border cleansed with alcohol and triple antibiotic applied. No further treatment required by patient/caregiver. -Patient to report any pedal injuries to medical professional immediately. -Patient/POA to call should there be question/concern in the interim.  No follow-ups on file.  Marzetta Board, DPM

## 2021-11-20 ENCOUNTER — Ambulatory Visit: Payer: No Typology Code available for payment source | Admitting: Podiatry

## 2022-01-01 ENCOUNTER — Other Ambulatory Visit: Payer: Self-pay

## 2022-01-01 ENCOUNTER — Ambulatory Visit (INDEPENDENT_AMBULATORY_CARE_PROVIDER_SITE_OTHER): Payer: No Typology Code available for payment source | Admitting: Podiatry

## 2022-01-01 DIAGNOSIS — B351 Tinea unguium: Secondary | ICD-10-CM

## 2022-01-01 DIAGNOSIS — M79675 Pain in left toe(s): Secondary | ICD-10-CM | POA: Diagnosis not present

## 2022-01-01 DIAGNOSIS — M2141 Flat foot [pes planus] (acquired), right foot: Secondary | ICD-10-CM | POA: Diagnosis not present

## 2022-01-01 DIAGNOSIS — E118 Type 2 diabetes mellitus with unspecified complications: Secondary | ICD-10-CM

## 2022-01-01 DIAGNOSIS — E119 Type 2 diabetes mellitus without complications: Secondary | ICD-10-CM | POA: Diagnosis not present

## 2022-01-01 DIAGNOSIS — M79674 Pain in right toe(s): Secondary | ICD-10-CM

## 2022-01-01 DIAGNOSIS — Z794 Long term (current) use of insulin: Secondary | ICD-10-CM

## 2022-01-01 DIAGNOSIS — M2142 Flat foot [pes planus] (acquired), left foot: Secondary | ICD-10-CM

## 2022-01-08 ENCOUNTER — Encounter: Payer: Self-pay | Admitting: Podiatry

## 2022-01-08 DIAGNOSIS — E119 Type 2 diabetes mellitus without complications: Secondary | ICD-10-CM | POA: Insufficient documentation

## 2022-01-08 NOTE — Progress Notes (Signed)
ANNUAL DIABETIC FOOT EXAM  Subjective: Tara Livingston presents today for for annual diabetic foot examination and painful elongated mycotic toenails 1-5 bilaterally which are tender when wearing enclosed shoe gear. Pain is relieved with periodic professional debridement..  Patient relates 11 year h/o diabetes.  Patient denies any h/o foot wounds.  Patient denies any numbness, tingling, burning, or pins/needle sensation in feet.  Patient's blood sugar was 187 mg/dl today.   Risk factors: diabetes, HTN.  Julian Hy, PA-C is patient's PCP. Last visit was 04/06/2021.  Past Medical History:  Diagnosis Date   Diabetes mellitus without complication ([place])    Hypertension    doesn't take medication   Patient Active Problem List   Diagnosis Date Noted   Diabetes mellitus ([place]) 01/08/2022   Irritable bowel syndrome 01/04/2021   Vitamin D deficiency 01/09/2018   Class 3 severe obesity due to excess calories with serious comorbidity and body mass index (BMI) of 50.0 to 59.9 in adult [facility]) 01/09/2018   Medication intolerance 01/09/2018   Essential hypertension 10/08/2017   Type 2 diabetes mellitus with complication, with long-term current use of insulin ([place]) 10/08/2017   Past Surgical History:  Procedure Laterality Date   BREAST SURGERY     CESAREAN SECTION     Current Outpatient Medications on File Prior to Visit  Medication Sig Dispense Refill   aspirin EC 81 MG tablet Take 1 tablet (81 mg total) by mouth every other day.     benzonatate (TESSALON) 100 MG capsule Take 100 mg by mouth 3 (three) times daily as needed.     Continuous Blood Gluc Receiver (DEXCOM G6 RECEIVER) DEVI AS DIRECTED 1 EVERY YEAR 365 DAYS     Continuous Blood Gluc Sensor (DEXCOM G6 SENSOR) MISC SMARTSIG:1 Topical Every 10 Days     Continuous Blood Gluc Transmit (DEXCOM G6 TRANSMITTER) MISC USE EVERY 90 DAYS     Diethylpropion HCl CR 75 MG TB24 Take 1 tablet by mouth daily.     Digital Therapy (HELLO  HEART BLOOD PRESSURE) KIT      DULoxetine (CYMBALTA) 20 MG capsule Take by mouth daily.     empagliflozin (JARDIANCE) 25 MG TABS tablet Take 25 mg by mouth daily. 90 tablet 1   Exenatide ER (BYDUREON BCISE) 2 MG/0.85ML AUIJ Inject 2 mg into the skin once a week. 12 pen 1   fluticasone (FLONASE) 50 MCG/ACT nasal spray Place 1 spray into both nostrils daily.     hydrochlorothiazide (HYDRODIURIL) 25 MG tablet Take by mouth.     Insulin Detemir (LEVEMIR FLEXTOUCH) 100 UNIT/ML Pen Inject 25 Units into the skin daily. 10 pen 1   Insulin Syringes, Disposable, U-100 1 ML MISC Use daily with insulin levimir 17 units 199 each 0   levonorgestrel (MIRENA, 52 MG,) 20 MCG/24HR IUD Mirena 20 mcg/24 hours (7 yrs) 52 mg intrauterine device  Take by intrauterine route.     losartan (COZAAR) 100 MG tablet Take 100 mg by mouth daily. (Patient not taking: Reported on 04/19/2021)     losartan (COZAAR) 25 MG tablet Take 25 mg by mouth daily. (Patient not taking: Reported on 04/19/2021)     losartan (COZAAR) 50 MG tablet Take 50 mg by mouth daily. (Patient not taking: Reported on 04/19/2021)     losartan-hydrochlorothiazide (HYZAAR) 100-25 MG tablet losartan 100 mg-hydrochlorothiazide 25 mg tablet  TAKE 1 TABLET BY MOUTH EVERY DAY     metformin (FORTAMET) 1000 MG (OSM) 24 hr tablet Take 2 tablets (2,000 mg total) by mouth  daily with breakfast. (Patient not taking: Reported on 04/19/2021) 180 tablet 1   metFORMIN (GLUCOPHAGE) 1000 MG tablet Take 1,000 mg by mouth 2 (two) times daily.     metoprolol succinate (TOPROL-XL) 50 MG 24 hr tablet Take 50 mg by mouth daily.     metroNIDAZOLE (FLAGYL) 500 MG tablet metronidazole 500 mg tablet  TAKE 1 TABLET BY MOUTH TWICE A DAY FOR 7 DAYS     NIFEdipine (ADALAT CC) 30 MG 24 hr tablet Take 30 mg by mouth daily. (Patient not taking: Reported on 04/19/2021)     NIFEdipine (ADALAT CC) 60 MG 24 hr tablet Take 60 mg by mouth daily.     NIFEdipine (ADALAT CC) 90 MG 24 hr tablet nifedipine  ER 90 mg tablet,extended release  TAKE 1 TABLET BY MOUTH EVERY DAY     nirmatrelvir & ritonavir (PAXLOVID) 20 x 150 MG & 10 x 100MG TBPK PLEASE SEE ATTACHED FOR DETAILED DIRECTIONS     omeprazole (PRILOSEC) 40 MG capsule Take 40 mg by mouth daily.     phentermine (ADIPEX-P) 37.5 MG tablet Take 37.5 mg by mouth daily.     Semaglutide, 1 MG/DOSE, (OZEMPIC, 1 MG/DOSE,) 4 MG/3ML SOPN Ozempic 1 mg/dose (4 mg/3 mL) subcutaneous pen injector  INJECT 1 MG SUBCUTANEOUSLY ONCE A WEEK 30 DAYS     sertraline (ZOLOFT) 100 MG tablet Take 1 tablet by mouth daily.     simvastatin (ZOCOR) 5 MG tablet Take 5 mg by mouth at bedtime.     SYNJARDY 12.04-999 MG TABS Take 1 tablet by mouth 2 (two) times daily.     topiramate (TOPAMAX) 100 MG tablet topiramate 100 mg tablet  TAKE 1 TABLET BY MOUTH EVERY DAY     traZODone (DESYREL) 50 MG tablet trazodone 50 mg tablet  1 (ONE) TABLET BY MOUTH IN EVENING     Vitamin D, Ergocalciferol, (DRISDOL) 50000 units CAPS capsule Take 1 capsule (50,000 Units total) by mouth every 7 (seven) days. (Patient not taking: Reported on 02/26/2018) 24 capsule 0   No current facility-administered medications on file prior to visit.    Allergies  Allergen Reactions   Statins     Other reaction(s): Myalgias (Muscle Pain)   Amlodipine Other (See Comments)    Muscle cramps   Lisinopril Other (See Comments)    Muscle cramps   Social History   Occupational History   Not on file  Tobacco Use   Smoking status: Former   Smokeless tobacco: Never  Substance and Sexual Activity   Alcohol use: Yes   Drug use: No   Sexual activity: Yes    Birth control/protection: I.U.D.   Family History  Problem Relation Age of Onset   Diabetes Mother    Hypertension Mother    Healthy Father    Immunization History  Administered Date(s) Administered   Tdap 01/18/2020     Review of Systems: Negative except as noted in the HPI.   Objective: There were no vitals filed for this visit.  STEVEY STAPLETON is a pleasant 46 y.o. female in NAD. AAO X 3.  Vascular Examination: CFT immediate b/l LE. Palpable DP/PT pulses b/l LE. Digital hair present b/l. Skin temperature gradient WNL b/l. No pain with calf compression b/l. Trace edema b/l LE. No cyanosis or clubbing noted b/l LE.  Dermatological Examination: No open wounds b/l LE. No interdigital macerations noted b/l LE. Toenails bilateral great toes elongated, discolored, dystrophic, thickened, and crumbly with subungual debris and tenderness to dorsal palpation.  Nondystrophic toenails 2-5 bilaterally. No hyperkeratotic nor porokeratotic lesions present on today's visit.  Musculoskeletal Examination: Normal muscle strength 5/5 to all lower extremity muscle groups bilaterally. Pes planus deformity noted bilateral LE.Marland Kitchen No pain, crepitus or joint limitation noted with ROM b/l LE.  Patient ambulates independently without assistive aids.  Footwear Assessment: Does the patient wear appropriate shoes? Yes. Does the patient need inserts/orthotics? No.  Neurological Examination: Protective sensation intact 5/5 intact bilaterally with 10g monofilament b/l. Vibratory sensation intact b/l.  Assessment: 1. Pain due to onychomycosis of toenails of both feet   2. Pes planus of both feet   3. Type 2 diabetes mellitus with complication, with long-term current use of insulin ([place])   4. Encounter for diabetic foot exam ([place])     ADA Risk Categorization: Low Risk :  Patient has all of the following: Intact protective sensation No prior foot ulcer  No severe deformity Pedal pulses present  Plan: -Diabetic foot examination performed today. -Continue foot and shoe inspections daily. Monitor blood glucose per PCP/Endocrinologist's recommendations. -Mycotic toenails bilateral great toes were debrided in length and girth with sterile nail nippers and dremel without iatrogenic bleeding. -Nondystrophic toenails trimmed 2-5 bilaterally. -Patient/POA to  call should there be question/concern in the interim.  Return in about 3 months (around 04/01/2022).  Marzetta Board, DPM

## 2022-04-02 ENCOUNTER — Ambulatory Visit (INDEPENDENT_AMBULATORY_CARE_PROVIDER_SITE_OTHER): Payer: No Typology Code available for payment source | Admitting: Podiatry

## 2022-04-02 DIAGNOSIS — F418 Other specified anxiety disorders: Secondary | ICD-10-CM | POA: Insufficient documentation

## 2022-04-02 DIAGNOSIS — M79675 Pain in left toe(s): Secondary | ICD-10-CM

## 2022-04-02 DIAGNOSIS — Z794 Long term (current) use of insulin: Secondary | ICD-10-CM | POA: Diagnosis not present

## 2022-04-02 DIAGNOSIS — B351 Tinea unguium: Secondary | ICD-10-CM

## 2022-04-02 DIAGNOSIS — Z6841 Body Mass Index (BMI) 40.0 and over, adult: Secondary | ICD-10-CM | POA: Insufficient documentation

## 2022-04-02 DIAGNOSIS — E118 Type 2 diabetes mellitus with unspecified complications: Secondary | ICD-10-CM

## 2022-04-02 DIAGNOSIS — M79674 Pain in right toe(s): Secondary | ICD-10-CM

## 2022-04-02 DIAGNOSIS — E1165 Type 2 diabetes mellitus with hyperglycemia: Secondary | ICD-10-CM | POA: Insufficient documentation

## 2022-04-12 NOTE — Progress Notes (Signed)
?  Subjective:  ?Patient ID: Tara Livingston, female    DOB: February 26, 1976,  MRN: [id number] ? ?Tara Livingston presents to clinic today for preventative diabetic foot care and painful elongated mycotic toenails 1-5 bilaterally which are tender when wearing enclosed shoe gear. Pain is relieved with periodic professional debridement.  ? ?Last known HgA1c was 7.8%. Patient did not check blood glucose today. ? ?New problem(s): None.  ? ?PCP is Carylon Perches, NP , and last visit was "2022". ? ?Allergies  ?Allergen Reactions  ? Statins   ?  Other reaction(s): Myalgias (Muscle Pain)  ? Amlodipine Other (See Comments)  ?  Muscle cramps  ? Lisinopril Other (See Comments)  ?  Muscle cramps  ? ? ?Review of Systems: Negative except as noted in the HPI. ? ?Objective: No changes noted in today's physical examination. ? ?Vascular Examination: ?Palpable pedal pulses b/l LE. Digital hair present b/l. No pedal edema b/l. Skin temperature gradient WNL b/l. No varicosities b/l. No pain with calf compression b/l. Trace edema noted BLE.. ? ?Dermatological Examination: ?Pedal skin with normal turgor, texture and tone b/l. No open wounds. No interdigital macerations b/l. Toenails b/l great toes thickened, discolored, dystrophic with subungual debris. There is pain on palpation to dorsal aspect of nailplates. Incurvated nailplate medial border left hallux.  Nail border hypertrophy minimal. There is tenderness to palpation. Sign(s) of infection: no clinical signs of infection noted on examination today... ? ?Neurological Examination: ?Protective sensation intact with 10 gram monofilament b/l LE. Vibratory sensation intact b/l LE.  ? ?Musculoskeletal Examination: ?Muscle strength 5/5 to all LE muscle groups b/l. Pes planus deformity noted bilateral LE. Patient ambulates independent of any assistive aids. ? ?Assessment/Plan: ?1. Pain due to onychomycosis of toenails of both feet   ?2. Type 2 diabetes mellitus with complication, with long-term  current use of insulin ([place])   ?-Patient was evaluated and treated. All patient's and/or POA's questions/concerns answered on today's visit. ?-Patient to continue soft, supportive shoe gear daily. ?-Toenails bilateral great toes debrided in length and girth without iatrogenic bleeding with sterile nail nipper and dremel.  ?-Nondystrophic toenails trimmed 2-5 bilaterally. ?-Offending nail border debrided and curretaged left great toe utilizing sterile nail nipper and currette. Border cleansed with alcohol and triple antibiotic applied. No further treatment required by patient/caregiver. ?-Patient/POA to call should there be question/concern in the interim.  ? ?Return in about 3 months (around 07/02/2022). ? ?Marzetta Board, DPM  ?

## 2022-07-08 ENCOUNTER — Ambulatory Visit: Payer: No Typology Code available for payment source | Admitting: Podiatry

## 2022-07-23 DIAGNOSIS — M545 Low back pain, unspecified: Secondary | ICD-10-CM | POA: Insufficient documentation

## 2022-07-23 DIAGNOSIS — M25551 Pain in right hip: Secondary | ICD-10-CM | POA: Insufficient documentation

## 2022-09-08 ENCOUNTER — Encounter: Payer: Self-pay | Admitting: Podiatry

## 2022-09-08 ENCOUNTER — Ambulatory Visit (INDEPENDENT_AMBULATORY_CARE_PROVIDER_SITE_OTHER): Payer: No Typology Code available for payment source | Admitting: Podiatry

## 2022-09-08 DIAGNOSIS — E785 Hyperlipidemia, unspecified: Secondary | ICD-10-CM | POA: Insufficient documentation

## 2022-09-08 DIAGNOSIS — M79674 Pain in right toe(s): Secondary | ICD-10-CM

## 2022-09-08 DIAGNOSIS — Z794 Long term (current) use of insulin: Secondary | ICD-10-CM | POA: Diagnosis not present

## 2022-09-08 DIAGNOSIS — B351 Tinea unguium: Secondary | ICD-10-CM | POA: Diagnosis not present

## 2022-09-08 DIAGNOSIS — M79675 Pain in left toe(s): Secondary | ICD-10-CM | POA: Diagnosis not present

## 2022-09-08 DIAGNOSIS — E118 Type 2 diabetes mellitus with unspecified complications: Secondary | ICD-10-CM

## 2022-09-12 NOTE — Progress Notes (Signed)
  Subjective:  Patient ID: Tara Livingston, female    DOB: 1976-05-03,  MRN: [id number]  Tara Livingston presents to clinic today for:  Chief Complaint  Patient presents with   Nail Problem    Diabetic foot care BS-142 A1C-6.9 PCP-[facility] PCP VST-2022    New problem(s): None.   Patient states she is on Ozempic and has lost 11 pounds.  PCP is Carylon Perches, NP.  Allergies  Allergen Reactions   Statins     Other reaction(s): Myalgias (Muscle Pain)   Amlodipine Other (See Comments)    Muscle cramps   Lisinopril Other (See Comments)    Muscle cramps    Review of Systems: Negative except as noted in the HPI.  Objective: No changes noted in today's physical examination.  Tara Livingston is a pleasant 46 y.o. female morbidly obese in NAD. AAO x 3.  Vascular Examination: Palpable pedal pulses b/l LE. Digital hair present b/l. No pedal edema b/l. Skin temperature gradient WNL b/l. No varicosities b/l. No pain with calf compression b/l. Trace edema noted BLE.Marland Kitchen  Dermatological Examination: Pedal skin with normal turgor, texture and tone b/l. No open wounds. No interdigital macerations b/l. Toenails b/l great toes thickened, discolored, dystrophic with subungual debris. There is pain on palpation to dorsal aspect of nailplates.   Incurvated nailplate medial border left hallux.  Nail border hypertrophy minimal. There is tenderness to palpation. Sign(s) of infection: no clinical signs of infection noted on examination today.  Neurological Examination: Protective sensation intact with 10 gram monofilament b/l LE. Vibratory sensation intact b/l LE.   Musculoskeletal Examination: Muscle strength 5/5 to all LE muscle groups b/l. Pes planus deformity noted bilateral LE. Patient ambulates independent of any assistive aids.  Assessment/Plan: 1. Pain due to onychomycosis of toenails of both feet   2. Type 2 diabetes mellitus with complication, with long-term current use of insulin (H[facility])      No orders of the defined types were placed in this encounter.   -Consent given for treatment as described below: -Examined patient. -Toenails bilateral great toes debrided in length and girth without iatrogenic bleeding with sterile nail nipper and dremel.  -Offending nail border debrided and curretaged bilateral great toes utilizing sterile nail nipper and currette. Border cleansed with alcohol and triple antibiotic applied. No further treatment required by patient/caregiver. Call office if there are any concerns. -Patient/POA to call should there be question/concern in the interim.   Return in about 3 months (around 12/09/2022).  Marzetta Board, DPM

## 2022-12-29 ENCOUNTER — Encounter: Payer: Self-pay | Admitting: Podiatry

## 2022-12-29 ENCOUNTER — Ambulatory Visit (INDEPENDENT_AMBULATORY_CARE_PROVIDER_SITE_OTHER): Payer: No Typology Code available for payment source | Admitting: Podiatry

## 2022-12-29 VITALS — BP 151/73

## 2022-12-29 DIAGNOSIS — M79675 Pain in left toe(s): Secondary | ICD-10-CM | POA: Diagnosis not present

## 2022-12-29 DIAGNOSIS — E118 Type 2 diabetes mellitus with unspecified complications: Secondary | ICD-10-CM

## 2022-12-29 DIAGNOSIS — M79674 Pain in right toe(s): Secondary | ICD-10-CM

## 2022-12-29 DIAGNOSIS — B351 Tinea unguium: Secondary | ICD-10-CM | POA: Diagnosis not present

## 2022-12-29 DIAGNOSIS — Z794 Long term (current) use of insulin: Secondary | ICD-10-CM

## 2022-12-29 NOTE — Progress Notes (Unsigned)
  Subjective:  Patient ID: Tara Livingston, female    DOB: [birth year],  MRN: [id number]  Tara Livingston presents to clinic today for {jgcomplaint:23593}  Chief Complaint  Patient presents with   Nail Problem    [facility] BS-95 A1C-6.7 PCP-[facility]henhen PCP VST-2022    New problem(s): None. {jgcomplaint:23593}  PCP is Carylon Perches, NP.  Allergies  Allergen Reactions   Statins     Other reaction(s): Myalgias (Muscle Pain)   Amlodipine Other (See Comments)    Muscle cramps   Lisinopril Other (See Comments)    Muscle cramps    Review of Systems: Negative except as noted in the HPI.  Objective: No changes noted in today's physical examination. Vitals:   12/29/22 1619  BP: (!) 151/73   Tara Livingston is a pleasant 47 y.o. female {jgbodyhabitus:24098} AAO x 3.  Vascular Examination: Palpable pedal pulses b/l LE. Digital hair present b/l. No pedal edema b/l. Skin temperature gradient WNL b/l. No varicosities b/l. No pain with calf compression b/l. Trace edema noted BLE.Marland Kitchen  Dermatological Examination: Pedal skin with normal turgor, texture and tone b/l. No open wounds. No interdigital macerations b/l. Toenails b/l great toes thickened, discolored, dystrophic with subungual debris. There is pain on palpation to dorsal aspect of nailplates.   Incurvated nailplate medial border left hallux.  Nail border hypertrophy minimal. There is tenderness to palpation. Sign(s) of infection: no clinical signs of infection noted on examination today.  Neurological Examination: Protective sensation intact with 10 gram monofilament b/l LE. Vibratory sensation intact b/l LE.   Musculoskeletal Examination: Muscle strength 5/5 to all LE muscle groups b/l. Pes planus deformity noted bilateral LE. Patient ambulates independent of any assistive aids.  Assessment/Plan: 1. Pain due to onychomycosis of toenails of both feet   2. Type 2 diabetes mellitus with complication, with long-term current use of insulin  (HCC)     No orders of the defined types were placed in this encounter.   None {Jgplan:23602::"-Patient/POA to call should there be question/concern in the interim."}   Return in about 3 months (around 03/30/2023).  Marzetta Board, DPM

## 2023-04-06 ENCOUNTER — Ambulatory Visit (INDEPENDENT_AMBULATORY_CARE_PROVIDER_SITE_OTHER): Payer: No Typology Code available for payment source | Admitting: Podiatry

## 2023-04-06 ENCOUNTER — Encounter: Payer: Self-pay | Admitting: Podiatry

## 2023-04-06 VITALS — BP 132/56

## 2023-04-06 DIAGNOSIS — E118 Type 2 diabetes mellitus with unspecified complications: Secondary | ICD-10-CM

## 2023-04-06 DIAGNOSIS — Z794 Long term (current) use of insulin: Secondary | ICD-10-CM

## 2023-04-06 DIAGNOSIS — M79675 Pain in left toe(s): Secondary | ICD-10-CM | POA: Diagnosis not present

## 2023-04-06 DIAGNOSIS — M2142 Flat foot [pes planus] (acquired), left foot: Secondary | ICD-10-CM | POA: Diagnosis not present

## 2023-04-06 DIAGNOSIS — M79674 Pain in right toe(s): Secondary | ICD-10-CM

## 2023-04-06 DIAGNOSIS — B351 Tinea unguium: Secondary | ICD-10-CM

## 2023-04-06 DIAGNOSIS — M2141 Flat foot [pes planus] (acquired), right foot: Secondary | ICD-10-CM

## 2023-04-06 DIAGNOSIS — E119 Type 2 diabetes mellitus without complications: Secondary | ICD-10-CM

## 2023-04-07 NOTE — Progress Notes (Signed)
ANNUAL DIABETIC FOOT EXAM  Subjective: Tara Livingston presents today for annual diabetic foot examination. Patient would like to know if she can get a dry pedicure. States the nail technician has certification to treat diabetics.  Chief Complaint  Patient presents with   Nail Problem    [facility] BS-134 A1C- PCP- Linus Galas PCP VST-03/2023    Patient confirms h/o diabetes.  Patient relates 15 year h/o diabetes.  Patient denies any h/o foot wounds.  Patient denies any numbness, tingling, burning, or pins/needle sensation in feet.  Risk factors: diabetes, HTN, hyperlipidemia, h/o tobacco use in remission.  Linus Galas, NP is patient's PCP.  Past Medical History:  Diagnosis Date   Diabetes mellitus without complication (H[facility])    Hypertension    doesn't take medication   Patient Active Problem List   Diagnosis Date Noted   Hyperlipidemia, unspecified 09/08/2022   Pain in joint of right hip 07/23/2022   Chronic low back pain 07/23/2022   Body mass index (BMI) 50.0-59.9, adult (HCC) 04/02/2022   Hyperglycemia due to type 2 diabetes mellitus (HCC) 04/02/2022   Mixed anxiety and depressive disorder 04/02/2022   Diabetes mellitus (HCC) 01/08/2022   Irritable bowel syndrome 01/04/2021   Vitamin D deficiency 01/09/2018   Class 3 severe obesity due to excess calories with serious comorbidity and body mass index (BMI) of 50.0 to 59.9 in adult (HCC) 01/09/2018   Medication intolerance 01/09/2018   Essential hypertension 10/08/2017   Type 2 diabetes mellitus with complication, with long-term current use of insulin (HCC) 10/08/2017   Past Surgical History:  Procedure Laterality Date   BREAST SURGERY     CESAREAN SECTION     Current Outpatient Medications on File Prior to Visit  Medication Sig Dispense Refill   meloxicam (MOBIC) 7.5 MG tablet Take 1 tablet by mouth daily.     aspirin EC 81 MG tablet Take 1 tablet (81 mg total) by mouth every other day.     Continuous Blood Gluc  Receiver (DEXCOM G6 RECEIVER) DEVI AS DIRECTED 1 EVERY YEAR 365 DAYS     Continuous Blood Gluc Sensor (DEXCOM G6 SENSOR) MISC SMARTSIG:1 Topical Every 10 Days     Continuous Blood Gluc Transmit (DEXCOM G6 TRANSMITTER) MISC USE EVERY 90 DAYS     Diethylpropion HCl CR 75 MG TB24 Take 1 tablet by mouth daily.     Digital Therapy (HELLO HEART BLOOD PRESSURE) KIT      empagliflozin (JARDIANCE) 25 MG TABS tablet Take 25 mg by mouth daily. 90 tablet 1   Exenatide ER (BYDUREON BCISE) 2 MG/0.85ML AUIJ Inject 2 mg into the skin once a week. 12 pen 1   felodipine (PLENDIL) 5 MG 24 hr tablet Take 1 tablet by mouth daily.     fluticasone (FLONASE) 50 MCG/ACT nasal spray Place 1 spray into both nostrils daily.     Insulin Detemir (LEVEMIR FLEXTOUCH) 100 UNIT/ML Pen Inject 25 Units into the skin daily. 10 pen 1   Insulin Syringes, Disposable, U-100 1 ML MISC Use daily with insulin levimir 17 units 199 each 0   levocetirizine (XYZAL ALLERGY 24HR) 5 MG tablet 1 tablet in the evening     levonorgestrel (MIRENA, 52 MG,) 20 MCG/24HR IUD Mirena 20 mcg/24 hours (7 yrs) 52 mg intrauterine device  Take by intrauterine route.     metroNIDAZOLE (FLAGYL) 500 MG tablet metronidazole 500 mg tablet  TAKE 1 TABLET BY MOUTH TWICE A DAY FOR 7 DAYS     NIFEdipine (ADALAT CC) 30  MG 24 hr tablet Take 30 mg by mouth daily. (Patient not taking: Reported on 04/19/2021)     NIFEdipine (ADALAT CC) 60 MG 24 hr tablet Take 60 mg by mouth daily.     NIFEdipine (ADALAT CC) 60 MG 24 hr tablet 1 tablet on an empty stomach     NIFEdipine (ADALAT CC) 90 MG 24 hr tablet nifedipine ER 90 mg tablet,extended release  TAKE 1 TABLET BY MOUTH EVERY DAY     nirmatrelvir & ritonavir (PAXLOVID) 20 x 150 MG & 10 x 100MG  TBPK PLEASE SEE ATTACHED FOR DETAILED DIRECTIONS     omeprazole (PRILOSEC) 40 MG capsule Take 40 mg by mouth daily.     phentermine (ADIPEX-P) 37.5 MG tablet Take 37.5 mg by mouth daily.     Semaglutide, 1 MG/DOSE, (OZEMPIC, 1 MG/DOSE,)  4 MG/3ML SOPN Ozempic 1 mg/dose (4 mg/3 mL) subcutaneous pen injector  INJECT 1 MG SUBCUTANEOUSLY ONCE A WEEK 30 DAYS     SYNJARDY 12.04-999 MG TABS Take 1 tablet by mouth 2 (two) times daily.     telmisartan (MICARDIS) 80 MG tablet Take 1 tablet by mouth daily.     topiramate (TOPAMAX) 100 MG tablet topiramate 100 mg tablet  TAKE 1 TABLET BY MOUTH EVERY DAY     topiramate (TOPAMAX) 100 MG tablet 1 tablet     torsemide (DEMADEX) 10 MG tablet Take 10 mg by mouth daily.     Vitamin D, Ergocalciferol, (DRISDOL) 50000 units CAPS capsule Take 1 capsule (50,000 Units total) by mouth every 7 (seven) days. (Patient not taking: Reported on 02/26/2018) 24 capsule 0   No current facility-administered medications on file prior to visit.    Allergies  Allergen Reactions   Statins     Other reaction(s): Myalgias (Muscle Pain)   Amlodipine Other (See Comments)    Muscle cramps   Lisinopril Other (See Comments)    Muscle cramps   Social History   Occupational History   Not on file  Tobacco Use   Smoking status: Former   Smokeless tobacco: Never  Substance and Sexual Activity   Alcohol use: Yes   Drug use: No   Sexual activity: Yes    Birth control/protection: I.U.D.   Family History  Problem Relation Age of Onset   Diabetes Mother    Hypertension Mother    Healthy Father    Immunization History  Administered Date(s) Administered   Tdap 01/18/2020     Review of Systems: Negative except as noted in the HPI.   Objective: Vitals:   04/06/23 1547  BP: (!) 132/56    Tara Livingston is a pleasant 47 y.o. female in NAD. AAO X 3.  Vascular Examination: CFT immediate b/l LE. Palpable DP/PT pulses b/l LE. Digital hair present b/l. Skin temperature gradient WNL b/l. No pain with calf compression b/l. No edema noted b/l. No cyanosis or clubbing noted b/l LE.  Dermatological Examination: Pedal skin is warm and supple b/l LE. No open wounds b/l LE. No interdigital macerations noted b/l LE.  Toenails bilateral great toes elongated, discolored, dystrophic, thickened, and crumbly with subungual debris and tenderness to dorsal palpation.  Neurological Examination: Protective sensation intact 5/5 intact bilaterally with 10g monofilament b/l. Vibratory sensation intact b/l. Proprioception intact bilaterally.  Musculoskeletal Examination: Normal muscle strength 5/5 to all lower extremity muscle groups bilaterally. Pes planus deformity noted bilateral LE.Marland Kitchen No pain, crepitus or joint limitation noted with ROM b/l LE.  Patient ambulates independently without assistive aids.   Lab  Results  Component Value Date   HGBA1C 10.1 01/08/2018   ADA Risk Categorization: Low Risk :  Patient has all of the following: Intact protective sensation No prior foot ulcer  No severe deformity Pedal pulses present  Assessment: 1. Pain due to onychomycosis of toenails of both feet   2. Pes planus of both feet   3. Type 2 diabetes mellitus with complication, with long-term current use of insulin (HCC)   4. Encounter for diabetic foot exam (HCC)     Plan: -Consent given for treatment as described below: -Examined patient. -Patient advised she may get pedicure with no shaving of skin. -Diabetic foot examination performed today. -Continue diabetic foot care principles: inspect feet daily, monitor glucose as recommended by PCP and/or Endocrinologist, and follow prescribed diet per PCP, Endocrinologist and/or dietician. -Toenails were debrided in length and girth bilateral great toes with sterile nail nippers and dremel without iatrogenic bleeding.  -Patient/POA to call should there be question/concern in the interim. Return in about 3 months (around 07/06/2023).  Freddie Breech, DPM

## 2023-07-17 ENCOUNTER — Ambulatory Visit (HOSPITAL_COMMUNITY)
Admission: EM | Admit: 2023-07-17 | Discharge: 2023-07-17 | Disposition: A | Discharge status: Home / Self Care | Payer: No Typology Code available for payment source | Attending: Emergency Medicine | Admitting: Emergency Medicine

## 2023-07-17 ENCOUNTER — Encounter (HOSPITAL_COMMUNITY): Payer: Self-pay | Admitting: Emergency Medicine

## 2023-07-17 ENCOUNTER — Ambulatory Visit (INDEPENDENT_AMBULATORY_CARE_PROVIDER_SITE_OTHER): Payer: No Typology Code available for payment source

## 2023-07-17 DIAGNOSIS — M25561 Pain in right knee: Secondary | ICD-10-CM

## 2023-07-17 DIAGNOSIS — S86911A Strain of unspecified muscle(s) and tendon(s) at lower leg level, right leg, initial encounter: Secondary | ICD-10-CM | POA: Diagnosis not present

## 2023-07-17 MED ORDER — NAPROXEN 500 MG PO TABS: 500.0000 mg | ORAL_TABLET | Freq: Two times a day (BID) | ORAL | 0 refills | Status: DC

## 2023-07-17 NOTE — ED Provider Notes (Signed)
[facility]    CSN: [id number] Arrival date & time: 07/17/23  0810      History   Chief Complaint Chief Complaint  Patient presents with   Knee Pain    HPI Tara Livingston is a 47 y.o. female.   Patient presents to clinic for right knee pain that started Sunday when her 2 puppies ran into her knee.  Upon impact her knee twisted outward.  Reports she was in so much pain she was unable to let out her other dogs.  She tried resting, icing, elevating and anti-inflammatories without much relief.  She is ambulatory with pain.  Reports a history of right knee pain. Endorses knee swelling and tenderness to palpation.    The history is provided by the patient and medical records.  Knee Pain   Past Medical History:  Diagnosis Date   Diabetes mellitus without complication (HCC)    Hypertension    doesn't take medication    Patient Active Problem List   Diagnosis Date Noted   Hyperlipidemia, unspecified 09/08/2022   Pain in joint of right hip 07/23/2022   Chronic low back pain 07/23/2022   Body mass index (BMI) 50.0-59.9, adult (HCC) 04/02/2022   Hyperglycemia due to type 2 diabetes mellitus (HCC) 04/02/2022   Mixed anxiety and depressive disorder 04/02/2022   Diabetes mellitus (HCC) 01/08/2022   Irritable bowel syndrome 01/04/2021   Vitamin D deficiency 01/09/2018   Class 3 severe obesity due to excess calories with serious comorbidity and body mass index (BMI) of 50.0 to 59.9 in adult (HCC) 01/09/2018   Medication intolerance 01/09/2018   Essential hypertension 10/08/2017   Type 2 diabetes mellitus with complication, with long-term current use of insulin (HCC) 10/08/2017    Past Surgical History:  Procedure Laterality Date   BREAST SURGERY     CESAREAN SECTION      OB History     Gravida  1   Para  1   Term  1   Preterm      AB      Living  1      SAB      IAB      Ectopic      Multiple      Live Births               Home  Medications    Prior to Admission medications   Medication Sig Start Date End Date Taking? Authorizing Provider  aspirin EC 81 MG tablet Take 1 tablet (81 mg total) by mouth every other day. 03/07/17  Yes Sherren Mocha, MD  Continuous Blood Gluc Receiver (DEXCOM G6 RECEIVER) DEVI AS DIRECTED 1 EVERY YEAR 365 DAYS 07/11/21  Yes [provider]  Continuous Blood Gluc Sensor (DEXCOM G6 SENSOR) MISC SMARTSIG:1 Topical Every 10 Days 08/13/21  Yes [provider]  Continuous Blood Gluc Transmit (DEXCOM G6 TRANSMITTER) MISC USE EVERY 90 DAYS 07/11/21  Yes [provider]  Diethylpropion HCl CR 75 MG TB24 Take 1 tablet by mouth daily. 11/02/19  Yes [provider]  Digital Therapy (HELLO HEART BLOOD PRESSURE) KIT  12/27/20  Yes [provider]  empagliflozin (JARDIANCE) 25 MG TABS tablet Take 25 mg by mouth daily. 01/09/18  Yes Sherren Mocha, MD  Exenatide ER (BYDUREON BCISE) 2 MG/0.85ML AUIJ Inject 2 mg into the skin once a week. 01/09/18  Yes Sherren Mocha, MD  felodipine (PLENDIL) 5 MG 24 hr tablet Take 1 tablet by mouth  daily.   Yes [provider]  fluticasone (FLONASE) 50 MCG/ACT nasal spray Place 1 spray into both nostrils daily. 04/16/21  Yes [provider]  Insulin Syringes, Disposable, U-100 1 ML MISC Use daily with insulin levimir 17 units 03/09/17  Yes Sherren Mocha, MD  levocetirizine (XYZAL ALLERGY 24HR) 5 MG tablet 1 tablet in the evening   Yes [provider]  levonorgestrel (MIRENA, 52 MG,) 20 MCG/24HR IUD Mirena 20 mcg/24 hours (7 yrs) 52 mg intrauterine device  Take by intrauterine route.   Yes [provider]  meloxicam (MOBIC) 7.5 MG tablet Take 1 tablet by mouth daily. 01/16/23  Yes [provider]  naproxen (NAPROSYN) 500 MG tablet Take 1 tablet (500 mg total) by mouth 2 (two) times daily. 07/17/23  Yes Rinaldo Ratel, [place] N, FNP  NIFEdipine (ADALAT CC) 30 MG 24 hr tablet Take 30 mg by mouth daily. 03/20/20  Yes [provider]  NIFEdipine (ADALAT CC) 60 MG 24 hr tablet Take 60 mg by mouth daily. 11/17/20  Yes [provider]  NIFEdipine (ADALAT CC) 60 MG 24 hr tablet 1 tablet on an empty stomach   Yes [provider]  NIFEdipine (ADALAT CC) 90 MG 24 hr tablet nifedipine ER 90 mg tablet,extended release  TAKE 1 TABLET BY MOUTH EVERY DAY   Yes [provider]  Semaglutide, 1 MG/DOSE, (OZEMPIC, 1 MG/DOSE,) 4 MG/3ML SOPN Ozempic 1 mg/dose (4 mg/3 mL) subcutaneous pen injector  INJECT 1 MG SUBCUTANEOUSLY ONCE A WEEK 30 DAYS   Yes [provider]  telmisartan (MICARDIS) 80 MG tablet Take 1 tablet by mouth daily.   Yes [provider]  torsemide (DEMADEX) 10 MG tablet Take 10 mg by mouth daily. 07/21/22  Yes [provider]  Vitamin D, Ergocalciferol, (DRISDOL) 50000 units CAPS capsule Take 1 capsule (50,000 Units total) by mouth every 7 (seven) days. 01/09/18  Yes Sherren Mocha, MD  Insulin Detemir (LEVEMIR FLEXTOUCH) 100 UNIT/ML Pen Inject 25 Units into the skin daily. 01/08/18   Sherren Mocha, MD  metroNIDAZOLE (FLAGYL) 500 MG tablet metronidazole 500 mg tablet  TAKE 1 TABLET BY MOUTH TWICE A DAY FOR 7 DAYS    [provider]  nirmatrelvir & ritonavir (PAXLOVID) 20 x 150 MG & 10 x 100MG  TBPK PLEASE SEE ATTACHED FOR DETAILED DIRECTIONS 06/19/21   [provider]  omeprazole (PRILOSEC) 40 MG capsule Take 40 mg by mouth daily. 10/28/20   [provider]  phentermine (ADIPEX-P) 37.5 MG tablet Take 37.5 mg by mouth daily. 08/18/19   [provider]  SYNJARDY 12.04-999 MG TABS Take 1 tablet by mouth 2 (two) times daily. 04/09/21   [provider]  topiramate (TOPAMAX) 100 MG tablet topiramate 100 mg tablet  TAKE 1 TABLET BY MOUTH EVERY DAY    [provider]  topiramate (TOPAMAX) 100 MG tablet 1 tablet    [provider]    Family History Family History  Problem Relation Age of Onset   Diabetes Mother     Hypertension Mother    Healthy Father     Social History Social History   Tobacco Use   Smoking status: Former   Smokeless tobacco: Never  Advertising account planner   Vaping status: Never Used  Substance Use Topics   Alcohol use: Yes   Drug use: No     Allergies   Statins, Amlodipine, and Lisinopril   Review of Systems Review of Systems  Musculoskeletal:  Positive for gait  problem and joint swelling.     Physical Exam Triage Vital Signs ED Triage Vitals  Encounter Vitals Group     BP 07/17/23 0825 (!) 165/99     Systolic BP Percentile --      Diastolic BP Percentile --      Pulse Rate 07/17/23 0825 75     Resp 07/17/23 0825 18     Temp 07/17/23 0825 98.7 F (37.1 C)     Temp Source 07/17/23 0825 Oral     SpO2 07/17/23 0825 95 %     Weight 07/17/23 0827 291 lb (132 kg)     Height 07/17/23 0827 5' 5.75" (1.67 m)     Head Circumference --      Peak Flow --      Pain Score 07/17/23 0827 5     Pain Loc --      Pain Education --      Exclude from Growth Chart --    No data found.  Updated Vital Signs BP (!) 165/99 (BP Location: Left Arm)   Pulse 75   Temp 98.7 F (37.1 C) (Oral)   Resp 18   Ht 5' 5.75" (1.67 m)   Wt 291 lb (132 kg)   SpO2 95%   BMI 47.33 kg/m   Visual Acuity Right Eye Distance:   Left Eye Distance:   Bilateral Distance:    Right Eye Near:   Left Eye Near:    Bilateral Near:     Physical Exam Vitals and nursing note reviewed.  Constitutional:      Appearance: Normal appearance.  HENT:     Head: Normocephalic and atraumatic.     Right Ear: External ear normal.     Left Ear: External ear normal.     Nose: Nose normal.     Mouth/Throat:     Mouth: Mucous membranes are moist.  Eyes:     General: No scleral icterus. Cardiovascular:     Rate and Rhythm: Normal rate.     Pulses: Normal pulses.  Pulmonary:     Effort: Pulmonary effort is normal. No respiratory distress.  Musculoskeletal:        General: Tenderness and signs of injury  present. No swelling or deformity. Normal range of motion.       Legs:     Comments: Right medial knee TTP. Endorses swelling, patient is obese and hard to differentiate if there is swelling present. ROM intact. Ambulatory.   Skin:    General: Skin is warm and dry.     Capillary Refill: Capillary refill takes less than 2 seconds.  Neurological:     General: No focal deficit present.     Mental Status: She is alert and oriented to person, place, and time.  Psychiatric:        Mood and Affect: Mood normal.        Behavior: Behavior is cooperative.      UC Treatments / Results  Labs (all labs ordered are listed, but only abnormal results are displayed) Labs Reviewed - No data to display  EKG   Radiology DG Knee Complete 4 Views Right  Result Date: 07/17/2023 CLINICAL DATA:  Right knee pain over the past 5 days EXAM: RIGHT KNEE - COMPLETE 4+ VIEW COMPARISON:  Report from 05/25/2000 FINDINGS: Moderate tricompartmental marginal spurring. No knee effusion. Mild medial compartmental articular space narrowing with subtle subcortical sclerosis along the medial tibial plateau. No fracture or acute bony findings. IMPRESSION: 1. Moderate osteoarthritis. No  acute findings. If pain persists despite conservative therapy, MRI may be warranted for further characterization. Electronically Signed   By: Gaylyn Rong M.D.   On: 07/17/2023 09:55    Procedures Procedures (including critical care time)  Medications Ordered in UC Medications - No data to display  Initial Impression / Assessment and Plan / UC Course  I have reviewed the triage vital signs and the nursing notes.  Pertinent labs & imaging results that were available during my care of the patient were reviewed by me and considered in my medical decision making (see chart for details).  Vitals and triage reviewed, patient is hemodynamically stable.  Right medial knee tenderness to palpation and pain with ambulation after injury 5 days  prior where knee twisted outward.  Imaging shows mild osteoarthritis, but no acute abnormalities.  Provided with knee sleeve in clinic, advised RICE method and Ortho follow-up if symptoms persist for advanced imaging.  Plan of care, follow-up care and return precautions given, no questions at this time.     Final Clinical Impressions(s) / UC Diagnoses   Final diagnoses:  Knee strain, right, initial encounter  Acute pain of right knee     Discharge Instructions      Your imaging showed some mild arthritis, but no acute changes.  If your pain persist despite rest, ice, elevation, anti-inflammatories and bracing and follow-up with an orthopedic for further evaluation with advanced imaging.  Return to clinic for new or urgent symptoms.     ED Prescriptions     Medication Sig Dispense Auth. Provider   naproxen (NAPROSYN) 500 MG tablet Take 1 tablet (500 mg total) by mouth 2 (two) times daily. 30 tablet Elba Schaber, [place] N, [place]      PDMP not reviewed this encounter.   Lorna Strother, [place] N, [place] 07/17/23 [phone]

## 2023-07-17 NOTE — ED Triage Notes (Signed)
Patient c/o right knee pain x 5 days after her dogs ran into her knee Sunday night.  Now she is having knee swelling and pain.  Patient has taken ASA and Tylenol for pain.

## 2023-07-17 NOTE — Discharge Instructions (Addendum)
Your imaging showed some mild arthritis, but no acute changes.  If your pain persist despite rest, ice, elevation, anti-inflammatories and bracing and follow-up with an orthopedic for further evaluation with advanced imaging.  Return to clinic for new or urgent symptoms.

## 2023-07-27 ENCOUNTER — Ambulatory Visit (INDEPENDENT_AMBULATORY_CARE_PROVIDER_SITE_OTHER): Payer: No Typology Code available for payment source | Admitting: Podiatry

## 2023-07-27 DIAGNOSIS — L600 Ingrowing nail: Secondary | ICD-10-CM

## 2023-07-27 NOTE — Progress Notes (Unsigned)
   Chief Complaint  Patient presents with   Nail Problem    DFC BS- 170 A1C-  6.0 PCP- Linus Galas,  NP  PCP VST-03/2023     HPI: 47 y.o. female presents today for her scheduled diabetic footcare appointment.  She states that she recently received a pedicure from the nail salon.  As she notes that she gets chronic ingrown toenails on the great toenails but does not like the salon employees work on these areas.  She notes that Dr. Donzetta Matters typically uses an instrument to remove any debris from the borders.  This typically gives her comfort/relief  Past Medical History:  Diagnosis Date   Diabetes mellitus without complication (HCC)    Hypertension    doesn't take medication    Past Surgical History:  Procedure Laterality Date   BREAST SURGERY     CESAREAN SECTION      Allergies  Allergen Reactions   Statins     Other reaction(s): Myalgias (Muscle Pain)   Amlodipine Other (See Comments)    Muscle cramps   Lisinopril Other (See Comments)    Muscle cramps    Physical Exam: There were no vitals filed for this visit.  General: The patient is alert and oriented x3 in no acute distress.  Dermatology: Skin is warm, dry and supple bilateral lower extremities. Interspaces are clear of maceration and debris.  Nails are neatly trimmed and polished.  The hallux nails do appear 3 to 4 mm thick however the color of the nails cannot be evaluated due to the nail polish in place.  The medial and lateral nail borders are incurvated but no signs of paronychia are noted today.  There is no evidence of drainage.  There are some pain with compression of the borders  Vascular: Palpable pedal pulses bilaterally. Capillary refill within normal limits.  No appreciable edema.  No erythema or calor.   Assessment/Plan of Care: 1. Ingrown toenail     Discussed clinical findings with patient today.  As since the patient recently had a pedicure and did not he did not need her nails trimmed, she was  asked to what Dr. Donzetta Matters typically does for her ingrown toenails.  She states that usually the areas are curetted to remove any debris from the nail margins to give her some relief.  This was performed today uneventfully.  We did discuss a PNA procedure, as well as any associated risks, to the bilateral hallux, bilateral borders, to resolve the chronic ingrowing toenails and prevent recurrence.  She was getting the procedure codes for this and says she works for an [employer], [employer] and she will check her own coverage to make sure there will not be any large out-of-pocket expense.  She will call to schedule this if she chooses to proceed   Clerance Lav, DPM, FACFAS [facility]     [address] [place], [place] [address]                Office [phone]  Fax [phone]

## 2023-11-03 ENCOUNTER — Ambulatory Visit: Payer: No Typology Code available for payment source | Admitting: Podiatry

## 2024-02-28 ENCOUNTER — Encounter (HOSPITAL_COMMUNITY): Payer: Self-pay | Admitting: Emergency Medicine

## 2024-02-28 ENCOUNTER — Other Ambulatory Visit: Payer: Self-pay

## 2024-02-28 ENCOUNTER — Ambulatory Visit (HOSPITAL_COMMUNITY)
Admission: EM | Admit: 2024-02-28 | Discharge: 2024-02-28 | Disposition: A | Discharge status: Home / Self Care | Attending: Emergency Medicine | Admitting: Emergency Medicine

## 2024-02-28 ENCOUNTER — Ambulatory Visit (INDEPENDENT_AMBULATORY_CARE_PROVIDER_SITE_OTHER)

## 2024-02-28 DIAGNOSIS — W19XXXA Unspecified fall, initial encounter: Secondary | ICD-10-CM

## 2024-02-28 DIAGNOSIS — M62838 Other muscle spasm: Secondary | ICD-10-CM | POA: Diagnosis not present

## 2024-02-28 DIAGNOSIS — M542 Cervicalgia: Secondary | ICD-10-CM

## 2024-02-28 DIAGNOSIS — M160 Bilateral primary osteoarthritis of hip: Secondary | ICD-10-CM

## 2024-02-28 DIAGNOSIS — M25552 Pain in left hip: Secondary | ICD-10-CM

## 2024-02-28 DIAGNOSIS — M5030 Other cervical disc degeneration, unspecified cervical region: Secondary | ICD-10-CM

## 2024-02-28 MED ORDER — KETOROLAC TROMETHAMINE 60 MG/2ML IM SOLN
60.0000 mg | Freq: Once | INTRAMUSCULAR | Status: AC
  Administered 2024-02-28: 60 mg via INTRAMUSCULAR

## 2024-02-28 MED ORDER — KETOROLAC TROMETHAMINE 60 MG/2ML IM SOLN
INTRAMUSCULAR | Status: AC
  Filled 2024-02-28: qty 2

## 2024-02-28 NOTE — ED Provider Notes (Signed)
 [facility]    CSN: [id number] Arrival date & time: 02/28/24  1126    HISTORY   Chief Complaint  Patient presents with   Neck Pain   Hip Pain   HPI Tara Livingston is a pleasant, 48 y.o. female who presents to urgent care today. Patient reports falling 1 week ago and continues to have neck pain, muscle spasm of the muscles around her neck of the right side, left hip pain.  Patient states she is having difficulty turning her head side to side more than a few degrees either way.  Patient denies difficulty with walking or standing but states this increases the pain in her left hip.  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  Patient states she has been unable to wash dishes, cook, perform any of her other usual household tasks or walk her dogs.  The history is provided by the patient.   Past Medical History:  Diagnosis Date   Diabetes mellitus without complication (HCC)    Hypertension    doesn't take medication   Patient Active Problem List   Diagnosis Date Noted   Hyperlipidemia, unspecified 09/08/2022   Pain in joint of right hip 07/23/2022   Chronic low back pain 07/23/2022   Body mass index (BMI) 50.0-59.9, adult (HCC) 04/02/2022   Hyperglycemia due to type 2 diabetes mellitus (HCC) 04/02/2022   Mixed anxiety and depressive disorder 04/02/2022   Diabetes mellitus (HCC) 01/08/2022   Irritable bowel syndrome 01/04/2021   Vitamin D deficiency 01/09/2018   Class 3 severe obesity due to excess calories with serious comorbidity and body mass index (BMI) of 50.0 to 59.9 in adult (HCC) 01/09/2018   Medication intolerance 01/09/2018   Essential hypertension 10/08/2017   Type 2 diabetes mellitus with complication, with long-term current use of insulin (HCC) 10/08/2017   Past Surgical History:  Procedure Laterality Date   BREAST SURGERY     CESAREAN SECTION     OB History     Gravida  1   Para  1   Term  1   Preterm      AB       Living  1      SAB      IAB      Ectopic      Multiple      Live Births             Home Medications    Prior to Admission medications   Medication Sig Start Date End Date Taking? Authorizing Provider  aspirin EC 81 MG tablet Take 1 tablet (81 mg total) by mouth every other day. 03/07/17   Sherren Mocha, MD  Continuous Blood Gluc Receiver (DEXCOM G6 RECEIVER) DEVI AS DIRECTED 1 EVERY YEAR 365 DAYS 07/11/21   [provider]  Continuous Blood Gluc Sensor (DEXCOM G6 SENSOR) MISC SMARTSIG:1 Topical Every 10 Days 08/13/21   [provider]  Continuous Blood Gluc Transmit (DEXCOM G6 TRANSMITTER) MISC USE EVERY 90 DAYS 07/11/21   [provider]  Diethylpropion HCl CR 75 MG TB24 Take 1 tablet by mouth daily. 11/02/19   [provider]  Digital Therapy (HELLO HEART BLOOD PRESSURE) KIT  12/27/20   [provider]  empagliflozin (JARDIANCE) 25 MG TABS tablet Take 25 mg by mouth daily. 01/09/18   Sherren Mocha, MD  Exenatide ER (BYDUREON BCISE) 2 MG/0.85ML AUIJ Inject 2 mg into the skin once a week. 01/09/18  Sherren Mocha, MD  felodipine (PLENDIL) 5 MG 24 hr tablet Take 1 tablet by mouth daily.    [provider]  fluticasone (FLONASE) 50 MCG/ACT nasal spray Place 1 spray into both nostrils daily. 04/16/21   [provider]  Insulin Detemir (LEVEMIR FLEXTOUCH) 100 UNIT/ML Pen Inject 25 Units into the skin daily. 01/08/18   Sherren Mocha, MD  Insulin Syringes, Disposable, U-100 1 ML MISC Use daily with insulin levimir 17 units 03/09/17   Sherren Mocha, MD  levocetirizine (XYZAL ALLERGY 24HR) 5 MG tablet 1 tablet in the evening    [provider]  levonorgestrel (MIRENA, 52 MG,) 20 MCG/24HR IUD Mirena 20 mcg/24 hours (7 yrs) 52 mg intrauterine device  Take by intrauterine route.    [provider]  meloxicam (MOBIC) 7.5 MG tablet Take 1 tablet by mouth daily. 01/16/23   [provider]  metroNIDAZOLE (FLAGYL) 500 MG tablet  metronidazole 500 mg tablet  TAKE 1 TABLET BY MOUTH TWICE A DAY FOR 7 DAYS    [provider]  naproxen (NAPROSYN) 500 MG tablet Take 1 tablet (500 mg total) by mouth 2 (two) times daily. 07/17/23   Garrison, [place] N, FNP  NIFEdipine (ADALAT CC) 30 MG 24 hr tablet Take 30 mg by mouth daily. 03/20/20   [provider]  NIFEdipine (ADALAT CC) 60 MG 24 hr tablet Take 60 mg by mouth daily. 11/17/20   [provider]  NIFEdipine (ADALAT CC) 60 MG 24 hr tablet 1 tablet on an empty stomach    [provider]  NIFEdipine (ADALAT CC) 90 MG 24 hr tablet nifedipine ER 90 mg tablet,extended release  TAKE 1 TABLET BY MOUTH EVERY DAY    [provider]  nirmatrelvir & ritonavir (PAXLOVID) 20 x 150 MG & 10 x 100MG  TBPK PLEASE SEE ATTACHED FOR DETAILED DIRECTIONS 06/19/21   [provider]  omeprazole (PRILOSEC) 40 MG capsule Take 40 mg by mouth daily. 10/28/20   [provider]  phentermine (ADIPEX-P) 37.5 MG tablet Take 37.5 mg by mouth daily. 08/18/19   [provider]  Semaglutide, 1 MG/DOSE, (OZEMPIC, 1 MG/DOSE,) 4 MG/3ML SOPN Ozempic 1 mg/dose (4 mg/3 mL) subcutaneous pen injector  INJECT 1 MG SUBCUTANEOUSLY ONCE A WEEK 30 DAYS    [provider]  SYNJARDY 12.04-999 MG TABS Take 1 tablet by mouth 2 (two) times daily. 04/09/21   [provider]  telmisartan (MICARDIS) 80 MG tablet Take 1 tablet by mouth daily.    [provider]  topiramate (TOPAMAX) 100 MG tablet topiramate 100 mg tablet  TAKE 1 TABLET BY MOUTH EVERY DAY    [provider]  topiramate (TOPAMAX) 100 MG tablet 1 tablet    [provider]  torsemide (DEMADEX) 10 MG tablet Take 10 mg by mouth daily. 07/21/22   [provider]  Vitamin D, Ergocalciferol, (DRISDOL) 50000 units CAPS capsule Take 1 capsule (50,000 Units total) by mouth every 7 (seven) days. 01/09/18   Sherren Mocha, MD    Family History Family History  Problem  Relation Age of Onset   Diabetes Mother    Hypertension Mother    Healthy Father    Social History Social History   Tobacco Use   Smoking status: Former   Smokeless tobacco: Never  Vaping Use   Vaping status: Never Used  Substance Use Topics   Alcohol use: Yes   Drug use: No   Allergies   Statins, Amlodipine, and Lisinopril  Review of Systems Review of Systems Pertinent findings revealed after performing a 14 point review of systems has been noted in the history of present illness.  Physical Exam Vital Signs BP (!) 145/85 (BP Location: Right Arm)   Pulse 80   Temp 98.3 F (36.8 C) (Oral)   Resp 18   SpO2 99%   No data found.  Physical Exam Vitals and nursing note reviewed.  Constitutional:      General: She is not in acute distress.    Appearance: Normal appearance.  HENT:     Head: Normocephalic and atraumatic.  Eyes:     Pupils: Pupils are equal, round, and reactive to light.  Cardiovascular:     Rate and Rhythm: Normal rate and regular rhythm.  Pulmonary:     Effort: Pulmonary effort is normal.     Breath sounds: Normal breath sounds.  Musculoskeletal:     Cervical back: Neck supple. Swelling, spasms and tenderness present. No edema, deformity, rigidity, torticollis, bony tenderness or crepitus. Pain with movement present. Decreased range of motion.     Left hip: No tenderness, bony tenderness or crepitus. Normal range of motion. Normal strength.  Skin:    General: Skin is warm and dry.  Neurological:     General: No focal deficit present.     Mental Status: She is alert and oriented to person, place, and time. Mental status is at baseline.  Psychiatric:        Mood and Affect: Mood normal.        Behavior: Behavior normal.        Thought Content: Thought content normal.        Judgment: Judgment normal.     UC Couse / Diagnostics / Procedures:     Radiology DG Cervical Spine Complete Result Date: 02/28/2024 CLINICAL DATA:  Fall 1 week ago.   Persistent neck pain. EXAM: CERVICAL SPINE - COMPLETE 4+ VIEW COMPARISON:  None Available. FINDINGS: There is no evidence of cervical spine fracture or prevertebral soft tissue swelling. Alignment is normal. Early degenerative disc disease is seen at C5-6 and C6-7. No other significant bone abnormalities are identified. IMPRESSION: No acute findings. Early degenerative disc disease at C5-6 and C6-7. Electronically Signed   By: Danae Orleans M.D.   On: 02/28/2024 14:45   DG Hip Unilat With Pelvis 2-3 Views Left Result Date: 02/28/2024 CLINICAL DATA:  Fall last Sunday with left hip pain. EXAM: DG HIP (WITH OR WITHOUT PELVIS) 2-3V LEFT COMPARISON:  None Available. FINDINGS: AP view of the pelvis and AP/frog leg views of the left hip. Femoral heads are located. Sacroiliac joints are symmetric. Moderate left and moderate to marked right hip joint space narrowing and osteophyte formation. The dedicated left hip radiographs are degraded by patient size. No convincing evidence of acute fracture. IMPRESSION: Bilateral hip osteoarthritis, worse on the right. No acute superimposed process. Electronically Signed   By: Jeronimo Greaves M.D.   On: 02/28/2024 14:38    Procedures Procedures (including critical care time) EKG  Pending results:  Labs Reviewed - No data to display  Medications Ordered in UC: Medications  ketorolac (TORADOL) injection 60 mg (60 mg Intramuscular Given 02/28/24 1453)    UC Diagnoses / Final Clinical Impressions(s)   I have reviewed the triage vital signs and the nursing notes.  Pertinent labs & imaging results that were available during my care of the patient were reviewed by me and considered in my medical decision making (see chart for details).  Final diagnoses:  Fall from standing, initial encounter  Neck pain  Left hip pain  Cervical paraspinous muscle spasm  Primary osteoarthritis of both hips  DDD (degenerative disc disease), cervical    Patient advised of x-ray  findings. Patient was provided with an injection of ketorolac during their visit today for acute pain relief. Patient was advised to: Take naproxen 500 mg twice daily for the next 5 to 7 days Take muscle relaxer 3 times daily (Patient has been advised that if this makes them sleepy, they can just take this at bedtime, up to 20 mg per dose, and try breaking the tablets in half or 5 mg per dose during the day) Begin acetaminophen 1000 mg 3 times daily on a scheduled basis. Apply ice pack to affected area 4 times daily for 20 minutes each time Consider physical therapy, chiropractic care, orthopedic follow-up Avoid stretching or strengthening exercises until pain is completely resolved Patient education handout provided for self-care at home. Return precautions advised  Please see discharge instructions below for details of plan of care as provided to patient. ED Prescriptions    naproxen (NAPROSYN) 500 MG tablet Take 1 tablet (500 mg total) by mouth 2 (two) times daily. 02/28/2024  baclofen (LIORESAL) 10 MG tablet Take 10 mg by mouth 3 (three) times daily as needed. 02/28/2024    PDMP not reviewed this encounter.    Discharge Instructions      The results of the x-rays that we performed during your visit today were not concerning for acute bony injury.  The x-ray of your neck showed that the discs between the bones in your neck are starting to degenerate, also called degenerative disc disease.  This causes pain which in turn causes muscle tension.  The x-ray of your left hip and pelvis show that you have arthritis in both hips commonly referred to as osteoarthritis.  Both of these x-rays reveal chronic conditions and your recent fall likely aggravated both.  The mainstay of therapy for musculoskeletal pain is reduction of inflammation and relaxation of tension which is causing inflammation.  Keep in mind, pain always begets more pain.  To help you stay ahead of your pain and inflammation, I  have provided the following regimen for you:   During your visit today, you received an injection of ketorolac, high-dose nonsteroidal anti-inflammatory pain medication that should significantly reduce your pain for the next 6 to 8 hours.   When you pick up your prescription from the pharmacy, please begin taking Tylenol 975 mg 3 times daily (every 8 hours).  Please know that It is safe to take a maximum 3000 mg of Tylenol in a 24-hour period.  Please do not exceed this amount.  Tylenol works best when taken on a scheduled basis.   When you pick up your prescription from the pharmacy, you can begin taking baclofen 10 mg.  This is a highly effective muscle relaxer and antispasmodic which should continue to provide you with relaxation of your tense muscles, allow you to sleep well and to keep your pain under control.  You can continue taking this medication 3 times daily as you need to.  If you find that this medication makes you too sleepy, you can break them in half for your daytime doses and, if needed double them for your nighttime dose.  Do not take more than 30 mg of baclofen in a 24-hour period.   This evening, please begin taking naproxen 500 mg twice daily.  Please keep  in mind that it is always easier to treat a little bit of pain that is to treat a lot of pain.  I recommend that for the next several days, you take this medication on a scheduled basis.  After that, take it when you begin to feel the pain returning, do not wait until you are in a lot of pain.   During the day, please set aside time to apply ice to the affected area 4 times daily for 20 minutes each application.  This can be achieved by using a bag of frozen peas or corn, a Ziploc bag filled with ice and water, or Ziploc bag filled with half rubbing alcohol and half Dawn dish detergent, frozen into a slush.  Please be careful not to apply ice directly to your skin, always place a soft cloth between you and the ice pack.   Over-the-counter products such as IcyHot and Biofreeze do not work nearly as well.   Please avoid attempts to stretch or strengthen the affected area until you are feeling completely pain-free.  Attempts to do so will only prolong the healing process.  Please consider following up with your primary care provider to discuss referral to physical therapy to find ways to strengthen the muscles around your neck and hip joints.  This will help slow the progress of arthritis.   I also recommend that you remain out of work for the next several days, I provided you with a note to return to work in 3 days.  If you feel that you need this time extended, please follow-up with your primary care provider or return to urgent care for reevaluation so that we can provide you with a note for another 3 days.   Thank you for visiting [facility] today.  We appreciate the opportunity to participate in your care.       Disposition Upon Discharge:  Condition: stable for discharge home Home: take medications as prescribed; routine discharge instructions as discussed; follow up as advised.  Patient presented with an acute illness with associated systemic symptoms and significant discomfort requiring urgent management. In my opinion, this is a condition that a prudent lay person (someone who possesses an average knowledge of health and medicine) may potentially expect to result in complications if not addressed urgently such as respiratory distress, impairment of bodily function or dysfunction of bodily organs.   Routine symptom specific, illness specific and/or disease specific instructions were discussed with the patient and/or caregiver at length.   As such, the patient has been evaluated and assessed, work-up was performed and treatment was provided in alignment with urgent care protocols and evidence based medicine.  Patient/parent/caregiver has been advised that the patient may require follow up for  further testing and treatment if the symptoms continue in spite of treatment, as clinically indicated and appropriate.  Patient/parent/caregiver has been advised to report to orthopedic urgent care clinic or return to the [facility] or PCP in 3-5 days if no better; follow-up with orthopedics, PCP or the Emergency Department if new signs and symptoms develop or if the current signs or symptoms continue to change or worsen for further workup, evaluation and treatment as clinically indicated and appropriate  The patient will follow up with their current PCP if and as advised. If the patient does not currently have a PCP we will have assisted them in obtaining one.   The patient may need specialty follow up if the symptoms continue, in spite of conservative treatment  and management, for further workup, evaluation, consultation and treatment as clinically indicated and appropriate.  Patient/parent/caregiver verbalized understanding and agreement of plan as discussed.  All questions were addressed during visit.  Please see discharge instructions below for further details of plan.  This office note has been dictated using Teaching laboratory technician.  Unfortunately, this method of dictation can sometimes lead to typographical or grammatical errors.  I apologize for your inconvenience in advance if this occurs.  Please do not hesitate to reach out to me if clarification is needed.      Theadora Rama Scales, PA-C 02/29/24 1431

## 2024-02-28 NOTE — Discharge Instructions (Addendum)
 The results of the x-rays that we performed during your visit today were not concerning for acute bony injury.  The x-ray of your neck showed that the discs between the bones in your neck are starting to degenerate, also called degenerative disc disease.  This causes pain which in turn causes muscle tension.  The x-ray of your left hip and pelvis show that you have arthritis in both hips commonly referred to as osteoarthritis.  Both of these x-rays reveal chronic conditions and your recent fall likely aggravated both.  The mainstay of therapy for musculoskeletal pain is reduction of inflammation and relaxation of tension which is causing inflammation.  Keep in mind, pain always begets more pain.  To help you stay ahead of your pain and inflammation, I have provided the following regimen for you:   During your visit today, you received an injection of ketorolac, high-dose nonsteroidal anti-inflammatory pain medication that should significantly reduce your pain for the next 6 to 8 hours.   When you pick up your prescription from the pharmacy, please begin taking Tylenol 975 mg 3 times daily (every 8 hours).  Please know that It is safe to take a maximum 3000 mg of Tylenol in a 24-hour period.  Please do not exceed this amount.  Tylenol works best when taken on a scheduled basis.   When you pick up your prescription from the pharmacy, you can begin taking baclofen 10 mg.  This is a highly effective muscle relaxer and antispasmodic which should continue to provide you with relaxation of your tense muscles, allow you to sleep well and to keep your pain under control.  You can continue taking this medication 3 times daily as you need to.  If you find that this medication makes you too sleepy, you can break them in half for your daytime doses and, if needed double them for your nighttime dose.  Do not take more than 30 mg of baclofen in a 24-hour period.   This evening, please begin taking naproxen 500 mg twice  daily.  Please keep in mind that it is always easier to treat a little bit of pain that is to treat a lot of pain.  I recommend that for the next several days, you take this medication on a scheduled basis.  After that, take it when you begin to feel the pain returning, do not wait until you are in a lot of pain.   During the day, please set aside time to apply ice to the affected area 4 times daily for 20 minutes each application.  This can be achieved by using a bag of frozen peas or corn, a Ziploc bag filled with ice and water, or Ziploc bag filled with half rubbing alcohol and half Dawn dish detergent, frozen into a slush.  Please be careful not to apply ice directly to your skin, always place a soft cloth between you and the ice pack.  Over-the-counter products such as IcyHot and Biofreeze do not work nearly as well.   Please avoid attempts to stretch or strengthen the affected area until you are feeling completely pain-free.  Attempts to do so will only prolong the healing process.  Please consider following up with your primary care provider to discuss referral to physical therapy to find ways to strengthen the muscles around your neck and hip joints.  This will help slow the progress of arthritis.   I also recommend that you remain out of work for the next several days,  I provided you with a note to return to work in 3 days.  If you feel that you need this time extended, please follow-up with your primary care provider or return to urgent care for reevaluation so that we can provide you with a note for another 3 days.   Thank you for visiting [facility] today.  We appreciate the opportunity to participate in your care.

## 2024-02-28 NOTE — ED Triage Notes (Signed)
 Pt states she fell last Sunday and now she has increase neck pain and left hip pain.

## 2024-02-29 ENCOUNTER — Telehealth: Payer: Self-pay | Admitting: Emergency Medicine

## 2024-02-29 MED ORDER — ACETAMINOPHEN 500 MG PO TABS: 1000.0000 mg | ORAL_TABLET | Freq: Three times a day (TID) | ORAL | 0 refills | Status: AC | PRN

## 2024-02-29 MED ORDER — BACLOFEN 10 MG PO TABS: 10.0000 mg | ORAL_TABLET | Freq: Three times a day (TID) | ORAL | 0 refills | Status: AC

## 2024-02-29 MED ORDER — NAPROXEN 500 MG PO TABS: 500.0000 mg | ORAL_TABLET | Freq: Two times a day (BID) | ORAL | 0 refills | Status: DC

## 2024-02-29 NOTE — Telephone Encounter (Signed)
 Rx sent to pharmacy

## 2024-09-05 ENCOUNTER — Encounter: Payer: Self-pay | Admitting: Orthopaedic Surgery

## 2024-09-05 ENCOUNTER — Ambulatory Visit: Admitting: Orthopaedic Surgery

## 2024-09-05 VITALS — Ht 65.75 in | Wt 289.0 lb

## 2024-09-05 DIAGNOSIS — M1612 Unilateral primary osteoarthritis, left hip: Secondary | ICD-10-CM | POA: Insufficient documentation

## 2024-09-05 DIAGNOSIS — M1611 Unilateral primary osteoarthritis, right hip: Secondary | ICD-10-CM | POA: Diagnosis not present

## 2024-09-05 NOTE — Progress Notes (Signed)
 The patient is a 48 year old female that I am seeing for the first time as a second opinion as a relates to well-documented arthritis in her right hip.  She does have arthritis in her left hip and the right one hurts her the worst.  She does walk with canes on occasion.  She is a diabetic and her last hemoglobin A1c was 6.4.  She follows this closely.  She says that her highest weight she was 330 pounds.  Today she is down to 289 pounds.  She is on Mounjaro.  Her BMI is 47.  She has had again a steroid injection in the right hip and that helped but only for a very short period of time.  She has been through physical therapy for her hips as well.  She knows those exercises to try.  She has been on Mobic in the past but it did increase her blood pressure.  On exam both hips move smoothly and the right hip is a little bit more stiffness but deafly pain in the groin with internal and external rotation.  Standing AP pelvis and lateral of both hips shows end-stage arthritis of the right hip with complete loss of joint space and bone-on-bone wear.  The left hip is also showing significant arthritic changes.  We had a long and thorough discussion about hip replacement surgery.  I would like her to lose a little bit more weight to show that she is on a weight loss journey before getting her scheduled for hip replacement.  I did lay her in the supine position to look at her soft tissue to see if there were any issues getting to her right hip and there is not any worrisome findings from my standpoint.  She will continue good blood glucose control.  We will see her back in 6 weeks and I would like a repeat weight and BMI calculation.  We can then give her more information about her replacement surgery at that visit.

## 2024-10-10 ENCOUNTER — Encounter: Payer: Self-pay | Admitting: Radiology

## 2024-10-19 ENCOUNTER — Encounter: Payer: Self-pay | Admitting: Orthopaedic Surgery

## 2024-10-19 ENCOUNTER — Ambulatory Visit (INDEPENDENT_AMBULATORY_CARE_PROVIDER_SITE_OTHER): Admitting: Orthopaedic Surgery

## 2024-10-19 VITALS — Wt 289.0 lb

## 2024-10-19 DIAGNOSIS — M1612 Unilateral primary osteoarthritis, left hip: Secondary | ICD-10-CM | POA: Diagnosis not present

## 2024-10-19 DIAGNOSIS — M1611 Unilateral primary osteoarthritis, right hip: Secondary | ICD-10-CM | POA: Diagnosis not present

## 2024-10-19 MED ORDER — NAPROXEN 500 MG PO TABS: 500.0000 mg | ORAL_TABLET | Freq: Two times a day (BID) | ORAL | 3 refills | Status: AC | PRN

## 2024-10-19 NOTE — Progress Notes (Signed)
 The patient is a 48 year old female well-known to us .  She has debilitating arthritis involving her right hip and this is definite affecting her posture.  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  The left hip has arthritis in it as well.  She is diabetic but under good control with a lower hemoglobin A1c.  Her BMI today is 47 and it was 47 at her last visit as well.  She is on Mounjaro as well.  She is going to talk with her primary care physician about increasing that dose.  From a insurance standpoint she knows that we need to set her up for physical therapy to strengthen the muscles around both of her hips and also that her BMI needs to be 40 or below before they will allow us  to proceed with hip replacement surgery.  From our standpoint we will see her back in 2 months for repeat weight and BMI calculation.  I will refill her naproxen  for her as well.

## 2024-10-20 ENCOUNTER — Other Ambulatory Visit: Payer: Self-pay

## 2024-10-20 DIAGNOSIS — M1612 Unilateral primary osteoarthritis, left hip: Secondary | ICD-10-CM

## 2024-10-20 DIAGNOSIS — M1611 Unilateral primary osteoarthritis, right hip: Secondary | ICD-10-CM

## 2024-11-08 ENCOUNTER — Encounter: Payer: Self-pay | Admitting: Rehabilitative and Restorative Service Providers"

## 2024-11-08 ENCOUNTER — Ambulatory Visit: Admitting: Rehabilitative and Restorative Service Providers"

## 2024-11-08 DIAGNOSIS — R262 Difficulty in walking, not elsewhere classified: Secondary | ICD-10-CM | POA: Diagnosis not present

## 2024-11-08 DIAGNOSIS — M25552 Pain in left hip: Secondary | ICD-10-CM | POA: Diagnosis not present

## 2024-11-08 DIAGNOSIS — M25551 Pain in right hip: Secondary | ICD-10-CM | POA: Diagnosis not present

## 2024-11-08 DIAGNOSIS — M6281 Muscle weakness (generalized): Secondary | ICD-10-CM

## 2024-11-08 NOTE — Therapy (Signed)
 OUTPATIENT PHYSICAL THERAPY EVALUATION   Patient Name: Tara Livingston MRN: [id number] DOB:06/05/1976, 48 y.o., female Today's Date: 11/08/2024  END OF SESSION:  PT End of Session - 11/08/24 1522     Visit Number 1    Number of Visits 20    Date for Recertification  01/17/25    Authorization Type [employer] 20% coinsurance    PT Start Time 1520    PT Stop Time 1551    PT Time Calculation (min) 31 min    Activity Tolerance Patient tolerated treatment well    Behavior During Therapy WFL for tasks assessed/performed          Past Medical History:  Diagnosis Date   Diabetes mellitus without complication (HCC)    Hypertension    doesn't take medication   Past Surgical History:  Procedure Laterality Date   BREAST SURGERY     CESAREAN SECTION     Patient Active Problem List   Diagnosis Date Noted   Unilateral primary osteoarthritis, right hip 09/05/2024   Unilateral primary osteoarthritis, left hip 09/05/2024   Hyperlipidemia, unspecified 09/08/2022   Pain in joint of right hip 07/23/2022   Chronic low back pain 07/23/2022   Body mass index (BMI) 50.0-59.9, adult (HCC) 04/02/2022   Hyperglycemia due to type 2 diabetes mellitus (HCC) 04/02/2022   Mixed anxiety and depressive disorder 04/02/2022   Diabetes mellitus (HCC) 01/08/2022   Irritable bowel syndrome 01/04/2021   Vitamin D  deficiency 01/09/2018   Class 3 severe obesity due to excess calories with serious comorbidity and body mass index (BMI) of 50.0 to 59.9 in adult (HCC) 01/09/2018   Medication intolerance 01/09/2018   Essential hypertension 10/08/2017   Type 2 diabetes mellitus with complication, with long-term current use of insulin  (HCC) 10/08/2017    PCP: Corlis Pagan NP  REFERRING PROVIDER: Vernetta Lonni GRADE, MD  REFERRING DIAG: M16.11 (ICD-10-CM) - Unilateral primary osteoarthritis, right hip M16.12 (ICD-10-CM) - Unilateral primary osteoarthritis, left hip E66.01 (ICD-10-CM) - Severe obesity (BMI >=  40) (HCC)  Rationale for Evaluation and Treatment: Rehabilitation  THERAPY DIAG:  Pain in left hip  Pain in right hip  Muscle weakness (generalized)  Difficulty in walking, not elsewhere classified  ONSET DATE: March 2025  SUBJECTIVE:                                                                                                                                                                                           SUBJECTIVE STATEMENT: Pt indicated getting worse over time, had a fall in March while walking dog.  Pt indicated progressive trouble with standing/walking activity. Rt greater than Lt.  Pt indicated occasional trouble when rolling over to Rt side. Pt indicated difficulty with daily activity with exercise walking, walking dogs, work in standing, housework activity.   Pt indicated discussion was started about possible THA.   PERTINENT HISTORY:  DM, HTN, obesity  PAIN:  NPRS scale: at worst 10/10, at best 0/10 Pain location: Lt and Rt hip.  Rt > Lt.   Pt indicated pain on posterior/lateral hip.  Pain description: Sharp, stabbing Aggravating factors: WB activity including walking, standing, cleaning Relieving factors: resting sitting, heating pad  PRECAUTIONS: None  WEIGHT BEARING RESTRICTIONS: No  FALLS:  Has patient fallen in last 6 months? No  LIVING ENVIRONMENT: Lives in: House/apartment Stairs: flight of stairs to bedroom.    OCCUPATION: Has standing, sitting desk.   PLOF: Independent, walking dogs, walking for exercise.   5 dogs.   PATIENT GOALS: Reduce pain, get back to activity.    OBJECTIVE:   PATIENT SURVEYS:  Patient-Specific Activity Scoring Scheme  0 represents "unable to perform." 10 represents "able to perform at prior level. 0 1 2 3 4 5 6 7 8 9  10 (Date and Score)   Activity Eval  11/08/2024    1. Standing prolonged (5 mins +)  4    2. Cleaning  4    3. Walking prolonged (5 mins +) 4   4. Walking dogs 4   5. Exercise  (walking program) 4   Score 4 avg    Total score = sum of the activity scores/number of activities Minimum detectable change (90%CI) for average score = 2 points Minimum detectable change (90%CI) for single activity score = 3 points  SCREENING FOR RED FLAGS: 11/08/2024 Bowel or bladder incontinence: No Cauda equina syndrome: No  COGNITION: 11/08/2024 Overall cognitive status: WFL normal      SENSATION: 11/08/2024 No specific testing.   MUSCLE LENGTH: 11/08/2024 No specific testing.   POSTURE:  11/08/2024 No specific assessment in standing today for back/hip alignment.  Can check in future.   PALPATION: 11/08/2024 Trigger points with concordant symptoms in Rt glute med/min.  Mild tenderness Lt glute med/min.  Both hips tenderness over greater trochanter.   LOWER EXTREMITY ROM:      Right Eval 11/08/2024 Left Eval 11/08/2024  Hip flexion    Hip extension    Hip abduction    Hip adduction    Hip internal rotation 12 PROM in 90 deg hip flexion  15 PROM in 90 deg hip flexion   Hip external rotation 50 PROM in 90 deg hip flexion  70 PROM in 90 deg hip flexion   Knee flexion    Knee extension    Ankle dorsiflexion    Ankle plantarflexion    Ankle inversion    Ankle eversion     (Blank rows = not tested)  LOWER EXTREMITY MMT:    MMT Right Eval 11/08/2024 Left Eval 11/08/2024  Hip flexion 4/5 4/5  Hip extension    Hip abduction 3/5 4/5  Hip adduction    Hip internal rotation    Hip external rotation    Knee flexion 5/5 5/5  Knee extension 4/5 4/5  Ankle dorsiflexion 5/5 5/5  Ankle plantarflexion    Ankle inversion    Ankle eversion     (Blank rows = not tested)   SPECIAL TESTS:  11/08/2024 None tested.   FUNCTIONAL TESTS:  .11/08/2024 Lt SLS: 7 seconds Rt SLS < 3 seconds with pain  GAIT: 11/08/2024 Ambulation independent.  Trendelenburg noted on Rt more  than Lt.                                                                                                                                                                                                                     TODAY'S TREATMENT:                                                                                                         DATE: 11/08/2024  Therex:    HEP instruction/performance c cues for techniques, handout provided.  Trial set performed of each for comprehension and symptom assessment.  See below for exercise list Encouraged use of towel or strap to help with stretching performed in HEP.  Discussed moving to tightness limit, not pain generation in stretching.   Manual Percussive device to Rt glute med/min trigger points.  Discussed possible purchase for use at home if desired.     PATIENT EDUCATION:  11/08/2024  Education details: HEP, POC Person educated: Patient Education method: Explanation, Demonstration, Verbal cues, and Handouts Education comprehension: verbalized understanding, returned demonstration, and verbal cues required  HOME EXERCISE PROGRAM: Access Code: [email or web address] URL: [email or web address] Date: 11/08/2024 Prepared by: Ozell Silvan  Exercises - Supine Bridge  - 1-2 x daily - 7 x weekly - 2-3 sets - 10-15 reps - 2 hold - Clamshell (Mirrored)  - 1-2 x  daily - 7 x weekly - 2-3 sets - 10-15 reps - Supine Figure 4 pull towards  - 1-2 x daily - 7 x weekly - 1 sets - 3-5 reps - 15-30 hold - Supine Figure 4 Piriformis Stretch  - 1-2 x daily - 7 x weekly - 1 sets - 3-5 reps - 15-30 hold  ASSESSMENT:  CLINICAL IMPRESSION: Patient is a 48 y.o. who comes to clinic with complaints of bilateral hip pain with mobility, strength and movement coordination deficits that impair their ability to perform usual daily and recreational functional activities without increase difficulty/symptoms at this time.  Patient to benefit from skilled PT services to address impairments and limitations to improve to previous level of function without restriction secondary to condition.   OBJECTIVE IMPAIRMENTS: Abnormal gait, decreased activity tolerance, decreased balance, decreased coordination, decreased endurance, decreased mobility, difficulty walking, decreased ROM, decreased strength, hypomobility, increased fascial restrictions, impaired perceived functional ability, increased muscle spasms, impaired flexibility, improper body mechanics, and pain.   ACTIVITY LIMITATIONS: carrying, lifting, bending, sitting, standing, squatting, sleeping, stairs, transfers, bed mobility, and locomotion level  PARTICIPATION LIMITATIONS: meal prep, cleaning, laundry, interpersonal relationship, driving, shopping, community activity, occupation, and dog care  PERSONAL FACTORS: Time since onset of injury/illness/exacerbation and DM, HTN, obesity are also affecting patient's functional outcome.   REHAB POTENTIAL: Good  CLINICAL DECISION MAKING: Stable/uncomplicated  EVALUATION COMPLEXITY: Low   GOALS: Goals reviewed with patient? Yes  SHORT TERM GOALS: (target date for Short term goals are 3 weeks 11/29/2024)  1. Patient will demonstrate independent use of home exercise program to maintain progress from in clinic treatments.  Goal status: New  LONG TERM GOALS: (target dates for  all long term goals are 10 weeks  01/17/2025 )   1. Patient will demonstrate/report pain at worst less than or equal to 2/10 to facilitate minimal limitation in daily activity secondary to pain symptoms.  Goal status: New   2. Patient will demonstrate independent use of home exercise program to facilitate ability to maintain/progress functional gains from skilled physical therapy services.  Goal status: New   3. Patient will demonstrate Patient specific functional scale avg > or = 8/10 to indicate reduced disability due to condition.   Goal status: New   4. Patient will demonstrate lumbar extension 100 % WFL s symptoms to facilitate upright standing, walking posture at PLOF s limitation.  Goal status: New   5.  Patient will demonstrate bilateral LE MMT 5/5  throughout to facilitate transfers, ambulation, stairs at PLOF.   Goal status: New   6.  Patient will demonstrate ascending/descending stairs reciprocally s UE assist for community integration.   Goal status: New   7.  Patient will demonstrate SLS testing bilateral > 10 seconds to facilitate stability in ambulation.  Goal Status: New  PLAN:  PT FREQUENCY: 1-2x/week  PT DURATION: 10 weeks  PLANNED INTERVENTIONS: Can include 02853- PT Re-evaluation, 97110-Therapeutic exercises, 97530- Therapeutic activity, [id number]- Neuromuscular re-education, 97535- Self Care, 97140- Manual therapy, [id number]- Gait training, [phone]- Orthotic Fit/training, [phone]- Canalith repositioning, [id number]- Aquatic Therapy, H9716- Electrical stimulation (unattended), [phone]  Physical performance testing, 02983- Vasopneumatic device, [id number]- Ultrasound, [id number]- Traction (mechanical), [id number]- Ionotophoresis 4mg /ml Dexamethasone,  20560 - Needle insertion w/o injection 1 or 2 muscles, 20561 - Needle insertion w/o injection 3 or more muscles.    Patient/Family education, Balance training, Stair training, Taping, Dry Needling, Joint mobilization, Joint manipulation, Spinal  manipulation, Spinal mobilization, Scar mobilization, Vestibular training, Visual/preceptual remediation/compensation, DME instructions, Cryotherapy, and Moist heat.  All performed as medically necessary.  All included unless contraindicated  PLAN FOR NEXT SESSION: Review HEP knowledge/results.  Percussive device to myofascial trigger points.  Progress hip mobility/strengthening as able.    Ozell Silvan, PT, DPT, OCS, ATC 11/08/24  3:57 PM

## 2024-11-18 ENCOUNTER — Encounter: Payer: Self-pay | Admitting: Rehabilitative and Restorative Service Providers"

## 2024-11-18 ENCOUNTER — Ambulatory Visit: Admitting: Rehabilitative and Restorative Service Providers"

## 2024-11-18 DIAGNOSIS — M25552 Pain in left hip: Secondary | ICD-10-CM

## 2024-11-18 DIAGNOSIS — R262 Difficulty in walking, not elsewhere classified: Secondary | ICD-10-CM | POA: Diagnosis not present

## 2024-11-18 DIAGNOSIS — M6281 Muscle weakness (generalized): Secondary | ICD-10-CM

## 2024-11-18 DIAGNOSIS — M25551 Pain in right hip: Secondary | ICD-10-CM | POA: Diagnosis not present

## 2024-11-18 NOTE — Therapy (Signed)
 OUTPATIENT PHYSICAL THERAPY TREATMENT   Patient Name: Tara Livingston MRN: [id number] DOB:08/12/1976, 48 y.o., female Today's Date: 11/18/2024  END OF SESSION:  PT End of Session - 11/18/24 1345     Visit Number 2    Number of Visits 20    Date for Recertification  01/17/25    Authorization Type [employer] 20% coinsurance    PT Start Time 1345    PT Stop Time 1427    PT Time Calculation (min) 42 min    Activity Tolerance Patient tolerated treatment well    Behavior During Therapy WFL for tasks assessed/performed           Past Medical History:  Diagnosis Date   Diabetes mellitus without complication (HCC)    Hypertension    doesn't take medication   Past Surgical History:  Procedure Laterality Date   BREAST SURGERY     CESAREAN SECTION     Patient Active Problem List   Diagnosis Date Noted   Unilateral primary osteoarthritis, right hip 09/05/2024   Unilateral primary osteoarthritis, left hip 09/05/2024   Hyperlipidemia, unspecified 09/08/2022   Pain in joint of right hip 07/23/2022   Chronic low back pain 07/23/2022   Body mass index (BMI) 50.0-59.9, adult (HCC) 04/02/2022   Hyperglycemia due to type 2 diabetes mellitus (HCC) 04/02/2022   Mixed anxiety and depressive disorder 04/02/2022   Diabetes mellitus (HCC) 01/08/2022   Irritable bowel syndrome 01/04/2021   Vitamin D  deficiency 01/09/2018   Class 3 severe obesity due to excess calories with serious comorbidity and body mass index (BMI) of 50.0 to 59.9 in adult (HCC) 01/09/2018   Medication intolerance 01/09/2018   Essential hypertension 10/08/2017   Type 2 diabetes mellitus with complication, with long-term current use of insulin  (HCC) 10/08/2017    PCP: Corlis Pagan NP  REFERRING PROVIDER: Vernetta Lonni GRADE, MD  REFERRING DIAG: M16.11 (ICD-10-CM) - Unilateral primary osteoarthritis, right hip M16.12 (ICD-10-CM) - Unilateral primary osteoarthritis, left hip E66.01 (ICD-10-CM) - Severe obesity (BMI >=  40) (HCC)  Rationale for Evaluation and Treatment: Rehabilitation  THERAPY DIAG:  Pain in left hip  Pain in right hip  Muscle weakness (generalized)  Difficulty in walking, not elsewhere classified  ONSET DATE: March 2025  SUBJECTIVE:                                                                                                                                                                                           SUBJECTIVE STATEMENT: [name] reports early HEP compliance.  She sees Dr. Vernetta 12/20/2023.    Pt indicated getting worse over time, had a fall in March while  walking dog.  Pt indicated progressive trouble with standing/walking activity. Rt greater than Lt.  Pt indicated occasional trouble when rolling over to Rt side. Pt indicated difficulty with daily activity with exercise walking, walking dogs, work in standing, housework activity.   Pt indicated discussion was started about possible THA.   PERTINENT HISTORY:  DM, HTN, obesity  PAIN:  NPRS scale: 0-5/10 with occasional 10/10 stab Pain location: Lt and Rt hip.  Rt > Lt.   Pt indicated pain on posterior/lateral hip.  Pain description: Ache, can be Sharp, stabbing Aggravating factors: WB activity including walking, standing, cleaning Relieving factors: resting sitting, heating pad  PRECAUTIONS: None  WEIGHT BEARING RESTRICTIONS: No  FALLS:  Has patient fallen in last 6 months? No  LIVING ENVIRONMENT: Lives in: House/apartment Stairs: flight of stairs to bedroom.    OCCUPATION: Has standing, sitting desk.   PLOF: Independent, walking dogs, walking for exercise.   5 dogs.   PATIENT GOALS: Reduce pain, get back to activity.    OBJECTIVE:   PATIENT SURVEYS:  Patient-Specific Activity Scoring Scheme  0 represents unable to perform. 10 represents able to perform at prior level. 0 1 2 3 4 5 6 7 8 9  10 (Date and Score)   Activity Eval  11/08/2024    1. Standing prolonged (5 mins +)  4     2. Cleaning  4    3. Walking prolonged (5 mins +) 4   4. Walking dogs 4   5. Exercise (walking program) 4   Score 4 avg    Total score = sum of the activity scores/number of activities Minimum detectable change (90%CI) for average score = 2 points Minimum detectable change (90%CI) for single activity score = 3 points  SCREENING FOR RED FLAGS: 11/08/2024 Bowel or bladder incontinence: No Cauda equina syndrome: No  COGNITION: 11/08/2024 Overall cognitive status: WFL normal      SENSATION: 11/08/2024 No specific testing.   MUSCLE LENGTH: 11/08/2024 No specific testing.   POSTURE:  11/08/2024 No specific assessment in standing today for back/hip alignment.  Can check in future.   PALPATION: 11/08/2024 Trigger points with concordant symptoms in Rt glute med/min.  Mild tenderness Lt glute med/min.  Both hips tenderness over greater trochanter.   LOWER EXTREMITY ROM:      Right Eval 11/08/2024 Left Eval 11/08/2024  Hip flexion    Hip extension    Hip abduction    Hip adduction    Hip internal rotation 12 PROM in 90 deg hip flexion  15 PROM in 90 deg hip flexion   Hip external rotation 50 PROM in 90 deg hip flexion  70 PROM in 90 deg hip flexion   Knee flexion    Knee extension    Ankle dorsiflexion    Ankle plantarflexion    Ankle inversion    Ankle eversion     (Blank rows = not tested)  LOWER EXTREMITY MMT:    MMT Right Eval 11/08/2024 Left Eval 11/08/2024  Hip flexion 4/5 4/5  Hip extension    Hip abduction 3/5 4/5  Hip adduction    Hip internal rotation    Hip external rotation    Knee flexion 5/5 5/5  Knee extension 4/5 4/5  Ankle dorsiflexion 5/5 5/5  Ankle plantarflexion    Ankle inversion    Ankle eversion     (Blank rows = not tested)   SPECIAL TESTS:  11/08/2024 None tested.   FUNCTIONAL TESTS:  .11/08/2024 Lt SLS: 7 seconds Rt  SLS < 3 seconds with pain  GAIT: 11/08/2024 Ambulation independent.  Trendelenburg noted on Rt more than Lt.                                                                                                                                                                                                                     TODAY'S TREATMENT:                                                                                                         DATE:  11/18/2024 Yoga Bridge 2 sets of 10 for 5 seconds Figure 4 stretch with push 5 x 20 seconds each side Figure 4 stretch with pull 5 x 20 seconds each side Side lie clams with Black Thera-Band 2 sets of 10 bilateral   Functional Activities: Hip hike at counter top 2 sets of 10 for 3 seconds (needed feedback, correction, for lateral lean with gait)   11/08/2024  Therex:    HEP instruction/performance c cues for techniques, handout provided.  Trial set performed of each for comprehension and symptom assessment.  See below for exercise list Encouraged use of towel or strap to help with stretching performed in HEP.  Discussed moving to tightness limit, not pain generation in stretching.   Manual Percussive device to Rt glute med/min trigger points.  Discussed possible purchase for use at home if desired.   PATIENT EDUCATION:  11/08/2024  Education details: HEP, POC Person  educated: Patient Education method: Explanation, Demonstration, Verbal cues, and Handouts Education comprehension: verbalized understanding, returned demonstration, and verbal cues required  HOME EXERCISE PROGRAM: Access Code: [email or web address] URL: [email or web address] Date: 11/18/2024 Prepared by: Lamar Ivory  Exercises - Supine Bridge  - 1-2 x daily - 7 x weekly - 2-3 sets - 10-15 reps - 2 hold - Clamshell (Mirrored)  - 1-2 x daily - 7 x weekly - 2-3 sets - 10-15 reps - Supine Figure 4 pull towards  - 1-2 x daily - 7 x weekly - 1 sets - 3-5 reps - 15-30 hold - Supine Figure 4 Piriformis Stretch  - 1-2 x daily - 7 x weekly - 1 sets - 3-5 reps - 15-30 hold - Standing Hip Hiking  - 5 x daily - 7 x weekly - 1 sets - 10 reps - 3 seconds hold  ASSESSMENT:  CLINICAL IMPRESSION: Sheretha is doing a good job with her day 1 exercises.  We progressed resistance with her hip abductors strength program and I gave her an option to do throughout the day at her standing desk.  Merril will benefit from the recommended plan of care.  Patient is a 48 y.o. who comes to clinic with complaints of bilateral hip pain with mobility, strength and movement coordination deficits that impair their ability to perform usual daily and recreational functional activities without increase difficulty/symptoms at this time.  Patient to benefit from skilled PT services to address impairments and limitations to improve to previous level of function without restriction secondary to condition.   OBJECTIVE IMPAIRMENTS: Abnormal gait, decreased activity tolerance, decreased balance, decreased coordination, decreased endurance, decreased mobility, difficulty walking, decreased ROM, decreased strength, hypomobility, increased fascial restrictions, impaired perceived functional ability, increased muscle spasms, impaired flexibility, improper body mechanics, and pain.   ACTIVITY LIMITATIONS: carrying, lifting, bending, sitting,  standing, squatting, sleeping, stairs, transfers, bed mobility, and locomotion level  PARTICIPATION LIMITATIONS: meal prep, cleaning, laundry, interpersonal relationship, driving, shopping, community activity, occupation, and dog care  PERSONAL FACTORS: Time since onset of injury/illness/exacerbation and DM, HTN, obesity are also affecting patient's functional outcome.   REHAB POTENTIAL: Good  CLINICAL DECISION MAKING: Stable/uncomplicated  EVALUATION COMPLEXITY: Low   GOALS: Goals reviewed with patient? Yes  SHORT TERM GOALS: (target date for Short term goals are 3 weeks 11/29/2024)  1. Patient will demonstrate independent use of home exercise program to maintain progress from in clinic treatments.  Goal status: Met 11/18/2024  LONG TERM GOALS: (target dates for all long term goals are 10 weeks  01/17/2025 )   1. Patient will demonstrate/report pain at worst less than or equal to 2/10 to facilitate minimal limitation in daily activity secondary to pain symptoms.  Goal status: New   2. Patient will demonstrate independent use of home exercise program to facilitate ability to maintain/progress functional gains from skilled physical therapy services.  Goal status: New   3. Patient will demonstrate Patient specific functional scale avg > or = 8/10 to indicate reduced disability due to condition.   Goal status: New   4. Patient will demonstrate lumbar extension 100 % WFL s symptoms to facilitate upright standing, walking posture at PLOF s limitation.  Goal status: New   5.  Patient will demonstrate bilateral LE MMT 5/5  throughout to facilitate transfers, ambulation, stairs at PLOF.   Goal status: New   6.  Patient will demonstrate ascending/descending stairs reciprocally s UE assist for community integration.   Goal status: New  7.  Patient will demonstrate SLS testing bilateral > 10 seconds to facilitate stability in ambulation.  Goal Status: New  PLAN:  PT  FREQUENCY: 1-2x/week  PT DURATION: 10 weeks  PLANNED INTERVENTIONS: Can include 02853- PT Re-evaluation, 97110-Therapeutic exercises, 97530- Therapeutic activity, [id number]- Neuromuscular re-education, 97535- Self Care, 97140- Manual therapy, [phone]- Gait training, [phone]- Orthotic Fit/training, [phone]- Canalith repositioning, [id number]- Aquatic Therapy, [phone]- Electrical stimulation (unattended), [id number] Physical performance testing, 97016- Vasopneumatic device, [id number]- Ultrasound, [id number]- Traction (mechanical), [id number]- Ionotophoresis 4mg /ml Dexamethasone,  20560 - Needle insertion w/o injection 1 or 2 muscles, 20561 - Needle insertion w/o injection 3 or more muscles.   Patient/Family education, Balance training, Stair training, Taping, Dry Needling, Joint mobilization, Joint manipulation, Spinal manipulation, Spinal mobilization, Scar mobilization, Vestibular training, Visual/preceptual remediation/compensation, DME instructions, Cryotherapy, and Moist heat.  All performed as medically necessary.  All included unless contraindicated  PLAN FOR NEXT SESSION: Review current HEP knowledge/results.  Percussive device to myofascial trigger points.  Progress hip mobility/strength.    Myer LELON Ivory PT, MPT 11/18/2024  5:02 PM

## 2024-11-21 ENCOUNTER — Ambulatory Visit: Admitting: Podiatry

## 2024-11-21 ENCOUNTER — Encounter: Payer: Self-pay | Admitting: Podiatry

## 2024-11-21 DIAGNOSIS — E118 Type 2 diabetes mellitus with unspecified complications: Secondary | ICD-10-CM

## 2024-11-21 DIAGNOSIS — M79674 Pain in right toe(s): Secondary | ICD-10-CM | POA: Diagnosis not present

## 2024-11-21 DIAGNOSIS — B351 Tinea unguium: Secondary | ICD-10-CM | POA: Diagnosis not present

## 2024-11-21 DIAGNOSIS — Z794 Long term (current) use of insulin: Secondary | ICD-10-CM

## 2024-11-21 DIAGNOSIS — M79675 Pain in left toe(s): Secondary | ICD-10-CM

## 2024-11-21 NOTE — Progress Notes (Signed)
°   ° °  Chief Complaint  Patient presents with   Diabetes    DFC NIDDM A1C 6.4. Toenail trim.    HPI: 48 y.o. female presents today for her scheduled diabetic footcare appointment.  She has pain and thickness of the nails that causes discomfort in shoe gear when ambulating.  She notes that she has not gotten a pedicure since the summer.  She is contemplating in the near future to have the great toenails temporarily removed to see if they will grow in healthier.  Past Medical History:  Diagnosis Date   Diabetes mellitus without complication (HCC)    Hypertension    doesn't take medication    Past Surgical History:  Procedure Laterality Date   BREAST SURGERY     CESAREAN SECTION      Allergies  Allergen Reactions   Statins     Other reaction(s): Myalgias (Muscle Pain)   Amlodipine  Other (See Comments)    Muscle cramps   Lisinopril Other (See Comments)    Muscle cramps    Physical Exam: General: The patient is alert and oriented x3 in no acute distress.  Dermatology: Skin is warm, dry and supple bilateral lower extremities. Interspaces are clear of maceration and debris.  Toenails x 10 are 3 mm thick with yellow/brown discoloration, subungual debris, distal onycholysis, and pain with compression.  They are all mildly elongated.  Vascular: Palpable pedal pulses bilaterally. Capillary refill within normal limits.  No appreciable edema.  No erythema or calor.   Assessment/Plan of Care: 1. Pain due to onychomycosis of toenails of both feet   2. Type 2 diabetes mellitus with complication, with long-term current use of insulin  [facility])     Discussed clinical findings with patient today.  She may opt to have the bilateral hallux toenails avulsed in the future.  I would recommend sending these to the lab for fungal nail culture to rule out any fungal involvement that could be treated while the new nail is growing in.  She expressed understanding and may call in the future to have this  done.  Mycotic nails x 10 were debrided with sterile nail nippers and a power debriding burr to decrease bulk and length  Follow-up in 4 months.   Awanda CHARM Imperial, DPM, FACFAS [facility]     [address] [place], [place] [address]                Office [phone]  Fax [phone]

## 2024-11-23 ENCOUNTER — Ambulatory Visit: Admitting: Rehabilitative and Restorative Service Providers"

## 2024-11-23 ENCOUNTER — Encounter: Payer: Self-pay | Admitting: Rehabilitative and Restorative Service Providers"

## 2024-11-23 DIAGNOSIS — M25552 Pain in left hip: Secondary | ICD-10-CM | POA: Diagnosis not present

## 2024-11-23 DIAGNOSIS — R262 Difficulty in walking, not elsewhere classified: Secondary | ICD-10-CM

## 2024-11-23 DIAGNOSIS — M25551 Pain in right hip: Secondary | ICD-10-CM

## 2024-11-23 DIAGNOSIS — M6281 Muscle weakness (generalized): Secondary | ICD-10-CM | POA: Diagnosis not present

## 2024-11-23 NOTE — Therapy (Signed)
 OUTPATIENT PHYSICAL THERAPY TREATMENT   Patient Name: Tara Livingston MRN: [id number] DOB:Dec 28, 1975, 48 y.o., female Today's Date: 11/23/2024  END OF SESSION:  PT End of Session - 11/23/24 1434     Visit Number 3    Number of Visits 20    Date for Recertification  01/17/25    Authorization Type [employer] 20% coinsurance    PT Start Time 1429    PT Stop Time 1508    PT Time Calculation (min) 39 min    Activity Tolerance Patient tolerated treatment well    Behavior During Therapy WFL for tasks assessed/performed            Past Medical History:  Diagnosis Date   Diabetes mellitus without complication (HCC)    Hypertension    doesn't take medication   Past Surgical History:  Procedure Laterality Date   BREAST SURGERY     CESAREAN SECTION     Patient Active Problem List   Diagnosis Date Noted   Unilateral primary osteoarthritis, right hip 09/05/2024   Unilateral primary osteoarthritis, left hip 09/05/2024   Hyperlipidemia, unspecified 09/08/2022   Pain in joint of right hip 07/23/2022   Chronic low back pain 07/23/2022   Body mass index (BMI) 50.0-59.9, adult (HCC) 04/02/2022   Hyperglycemia due to type 2 diabetes mellitus (HCC) 04/02/2022   Mixed anxiety and depressive disorder 04/02/2022   Diabetes mellitus (HCC) 01/08/2022   Irritable bowel syndrome 01/04/2021   Vitamin D  deficiency 01/09/2018   Class 3 severe obesity due to excess calories with serious comorbidity and body mass index (BMI) of 50.0 to 59.9 in adult (HCC) 01/09/2018   Medication intolerance 01/09/2018   Essential hypertension 10/08/2017   Type 2 diabetes mellitus with complication, with long-term current use of insulin  (HCC) 10/08/2017    PCP: Corlis Pagan NP  REFERRING PROVIDER: Vernetta Lonni GRADE, MD  REFERRING DIAG: M16.11 (ICD-10-CM) - Unilateral primary osteoarthritis, right hip M16.12 (ICD-10-CM) - Unilateral primary osteoarthritis, left hip E66.01 (ICD-10-CM) - Severe obesity (BMI  >= 40) (HCC)  Rationale for Evaluation and Treatment: Rehabilitation  THERAPY DIAG:  Pain in left hip  Pain in right hip  Muscle weakness (generalized)  Difficulty in walking, not elsewhere classified  ONSET DATE: March 2025  SUBJECTIVE:                                                                                                                                                                                           SUBJECTIVE STATEMENT: Pt indicated feeling some lessening of symptoms since eval.  Still feeling symptoms with mobility.   Pt indicated getting up more frequently.  Pt  indicated having gym membership and may plan on going back.    PERTINENT HISTORY:  DM, HTN, obesity Eval info:  Pt indicated getting worse over time, had a fall in March while walking dog.  Pt indicated progressive trouble with standing/walking activity. Rt greater than Lt.  Pt indicated occasional trouble when rolling over to Rt side. Pt indicated difficulty with daily activity with exercise walking, walking dogs, work in standing, housework activity.   Pt indicated discussion was started about possible THA.   PAIN:  NPRS scale: 0-5/10 with occasional 10/10 stab Pain location: Lt and Rt hip.  Rt > Lt.   Pt indicated pain on posterior/lateral hip.  Pain description: Ache, can be Sharp, stabbing Aggravating factors: WB activity including walking, standing, cleaning Relieving factors: resting sitting, heating pad  PRECAUTIONS: None  WEIGHT BEARING RESTRICTIONS: No  FALLS:  Has patient fallen in last 6 months? No  LIVING ENVIRONMENT: Lives in: House/apartment Stairs: flight of stairs to bedroom.    OCCUPATION: Has standing, sitting desk.   PLOF: Independent, walking dogs, walking for exercise.   5 dogs.   PATIENT GOALS: Reduce pain, get back to activity.    OBJECTIVE:   PATIENT SURVEYS:  Patient-Specific Activity Scoring Scheme  0 represents unable to perform. 10  represents able to perform at prior level. 0 1 2 3 4 5 6 7 8 9  10 (Date and Score)   Activity Eval  11/08/2024    1. Standing prolonged (5 mins +)  4    2. Cleaning  4    3. Walking prolonged (5 mins +) 4   4. Walking dogs 4   5. Exercise (walking program) 4   Score 4 avg    Total score = sum of the activity scores/number of activities Minimum detectable change (90%CI) for average score = 2 points Minimum detectable change (90%CI) for single activity score = 3 points  SCREENING FOR RED FLAGS: 11/08/2024 Bowel or bladder incontinence: No Cauda equina syndrome: No  COGNITION: 11/08/2024 Overall cognitive status: WFL normal      SENSATION: 11/08/2024 No specific testing.   MUSCLE LENGTH: 11/08/2024 No specific testing.   POSTURE:  11/08/2024 No specific assessment in standing today for back/hip alignment.  Can check in future.   PALPATION: 11/08/2024 Trigger points with concordant symptoms in Rt glute med/min.  Mild tenderness Lt glute med/min.  Both hips tenderness over greater trochanter.   LOWER EXTREMITY ROM:      Right Eval 11/08/2024 Left Eval 11/08/2024 Right 11/23/2024 Left 11/23/2024  Hip flexion      Hip extension      Hip abduction      Hip adduction      Hip internal rotation 12 PROM in 90 deg hip flexion  15 PROM in 90 deg hip flexion  20 PROM in 90 deg hip flexion  17 PROM in 90 deg hip flexion   Hip external rotation 50 PROM in 90 deg hip flexion  70 PROM in 90 deg hip flexion  62 PROM in 90 deg hip flexion  68 PROM in 90 deg hip flexion   Knee flexion      Knee extension      Ankle dorsiflexion      Ankle plantarflexion      Ankle inversion      Ankle eversion       (Blank rows = not tested)  LOWER EXTREMITY MMT:    MMT Right Eval 11/08/2024 Left Eval 11/08/2024  Hip flexion 4/5 4/5  Hip extension    Hip abduction 3/5 4/5  Hip adduction    Hip internal rotation    Hip external rotation    Knee flexion 5/5 5/5  Knee extension 4/5 4/5   Ankle dorsiflexion 5/5 5/5  Ankle plantarflexion    Ankle inversion    Ankle eversion     (Blank rows = not tested)   SPECIAL TESTS:  11/08/2024 None tested.   FUNCTIONAL TESTS:  .11/08/2024 Lt SLS: 7 seconds Rt SLS < 3 seconds with pain  GAIT: 11/08/2024 Ambulation independent.  Trendelenburg noted on Rt more than Lt.                                                                                                                                                                                                                    TODAY'S TREATMENT:                                                                                                         DATE:  11/23/2024 Therex: Nustep lvl 6 10 mins UE/LE for ROM, endurance.  Supine figure 4 pull towards 30 sec x 3 bilateral Supine figure 4 push away 30 sec x 3 bilateral Supine hooklying bridge 2-3 sec hold 2 x 10   Manual Percussive device to Rt glute med/min trigger points.  Discussed possible purchase for use at home if desired.      TODAY'S TREATMENT:                                                                                                         DATE: 11/18/2024 Yoga Bridge 2 sets  of 10 for 5 seconds Figure 4 stretch with push 5 x 20 seconds each side Figure 4 stretch with pull 5 x 20 seconds each side Side lie clams with Black Thera-Band 2 sets of 10 bilateral  Functional Activities: Hip hike at counter top 2 sets of 10 for 3 seconds (needed feedback, correction, for lateral lean with gait)   TODAY'S TREATMENT:                                                                                                         DATE: 11/08/2024  Therex:    HEP instruction/performance c cues for techniques, handout provided.  Trial set performed of each for comprehension and symptom assessment.  See below for exercise list Encouraged use of towel or strap to help with stretching performed in HEP.  Discussed moving to tightness  limit, not pain generation in stretching.   Manual Percussive device to Rt glute med/min trigger points.  Discussed possible purchase for use at home if desired.   PATIENT EDUCATION:  11/08/2024  Education details: HEP, POC Person educated: Patient Education method: Explanation, Demonstration, Verbal cues, and Handouts Education comprehension: verbalized understanding, returned demonstration, and verbal cues required  HOME EXERCISE PROGRAM: Access Code: [email or web address] URL: [email or web address] Date: 11/18/2024 Prepared by: Lamar Ivory  Exercises - Supine Bridge  - 1-2 x daily - 7 x weekly - 2-3 sets - 10-15 reps - 2 hold - Clamshell (Mirrored)  - 1-2 x daily - 7 x weekly - 2-3 sets - 10-15 reps - Supine Figure 4 pull towards  - 1-2 x daily - 7 x weekly - 1 sets - 3-5 reps - 15-30 hold - Supine Figure 4 Piriformis Stretch  - 1-2 x daily - 7 x weekly - 1 sets - 3-5 reps - 15-30 hold - Standing Hip Hiking  - 5 x daily - 7 x weekly - 1 sets - 10 reps - 3 seconds hold  ASSESSMENT:  CLINICAL IMPRESSION: Continued focus on mobility gains as able.  Percussive device reported as beneficial so performed again today for Rt lateral hip.  Pt demonstrated mild gains in hip mobility as reassessed today.     OBJECTIVE IMPAIRMENTS: Abnormal gait, decreased activity tolerance, decreased balance, decreased coordination, decreased endurance, decreased mobility, difficulty walking, decreased ROM, decreased strength, hypomobility, increased fascial restrictions, impaired perceived functional ability, increased muscle spasms, impaired flexibility, improper body mechanics, and pain.   ACTIVITY LIMITATIONS: carrying, lifting, bending, sitting, standing, squatting, sleeping, stairs, transfers, bed mobility, and locomotion level  PARTICIPATION LIMITATIONS: meal prep, cleaning, laundry, interpersonal relationship, driving, shopping, community activity, occupation, and dog care  PERSONAL FACTORS: Time since onset of injury/illness/exacerbation and DM, HTN, obesity are also affecting patient's functional outcome.   REHAB POTENTIAL: Good  CLINICAL DECISION MAKING: Stable/uncomplicated  EVALUATION COMPLEXITY: Low   GOALS: Goals reviewed with patient? Yes  SHORT TERM GOALS: (target date for Short term goals are 3 weeks 11/29/2024)  1. Patient will demonstrate independent use of home exercise program to maintain progress from in clinic treatments.  Goal status: Met 11/18/2024  LONG TERM GOALS: (target dates for all long term goals are 10 weeks  01/17/2025 )   1. Patient will demonstrate/report pain at worst less than or equal to 2/10 to facilitate minimal limitation in daily activity secondary to pain symptoms.  Goal status: New   2. Patient will demonstrate independent use of home exercise program to facilitate ability to maintain/progress functional gains from skilled physical therapy services.  Goal status: New   3. Patient will demonstrate Patient specific functional scale avg > or = 8/10 to indicate reduced disability due to condition.   Goal status: New   4. Patient will demonstrate lumbar extension 100 % WFL s symptoms to facilitate upright standing, walking posture at PLOF s  limitation.  Goal status: New   5.  Patient will demonstrate bilateral LE MMT 5/5  throughout to facilitate transfers, ambulation, stairs at PLOF.   Goal status: New   6.  Patient will demonstrate ascending/descending stairs reciprocally s UE assist for community integration.   Goal status: New   7.  Patient will demonstrate SLS testing bilateral > 10 seconds to facilitate stability in ambulation.  Goal Status: New  PLAN:  PT FREQUENCY: 1-2x/week  PT DURATION: 10 weeks  PLANNED INTERVENTIONS: Can include 02853- PT Re-evaluation, 97110-Therapeutic exercises, 97530- Therapeutic activity, [id number]- Neuromuscular re-education, 97535- Self Care, 97140- Manual therapy, [id number]- Gait training, [phone]- Orthotic Fit/training, [phone]- Canalith repositioning, [id number]- Aquatic Therapy, H9716- Electrical stimulation (unattended), [id number] Physical performance testing, 97016- Vasopneumatic device, [id number]- Ultrasound,  02987- Traction (mechanical), 02966- Ionotophoresis 4mg /ml Dexamethasone,  20560 - Needle insertion w/o injection 1 or 2 muscles, 20561 - Needle insertion w/o injection 3 or more muscles.   Patient/Family education, Balance training, Stair training, Taping, Dry Needling, Joint mobilization, Joint manipulation, Spinal manipulation, Spinal mobilization, Scar mobilization, Vestibular training, Visual/preceptual remediation/compensation, DME instructions, Cryotherapy, and Moist heat.  All performed as medically necessary.  All included unless contraindicated  PLAN FOR NEXT SESSION: Strength gains, general mobility gains.    Ozell Silvan, PT, DPT, OCS, ATC 11/23/2024  3:05 PM

## 2024-11-25 ENCOUNTER — Encounter: Payer: Self-pay | Admitting: Rehabilitative and Restorative Service Providers"

## 2024-11-25 ENCOUNTER — Ambulatory Visit: Admitting: Rehabilitative and Restorative Service Providers"

## 2024-11-25 DIAGNOSIS — R262 Difficulty in walking, not elsewhere classified: Secondary | ICD-10-CM

## 2024-11-25 DIAGNOSIS — M25552 Pain in left hip: Secondary | ICD-10-CM

## 2024-11-25 DIAGNOSIS — M25551 Pain in right hip: Secondary | ICD-10-CM

## 2024-11-25 DIAGNOSIS — M6281 Muscle weakness (generalized): Secondary | ICD-10-CM | POA: Diagnosis not present

## 2024-11-25 NOTE — Therapy (Signed)
 " OUTPATIENT PHYSICAL THERAPY TREATMENT   Patient Name: Tara Livingston MRN: [id number] DOB:1976/09/17, 48 y.o., female Today's Date: 11/25/2024  END OF SESSION:  PT End of Session - 11/25/24 1519     Visit Number 4    Number of Visits 20    Date for Recertification  01/17/25    Authorization Type [employer] 20% coinsurance    PT Start Time 1519    PT Stop Time 1558    PT Time Calculation (min) 39 min    Activity Tolerance Patient tolerated treatment well;No increased pain;Patient limited by pain    Behavior During Therapy [facility] for tasks assessed/performed            Past Medical History:  Diagnosis Date   Diabetes mellitus without complication (HCC)    Hypertension    doesn't take medication   Past Surgical History:  Procedure Laterality Date   BREAST SURGERY     CESAREAN SECTION     Patient Active Problem List   Diagnosis Date Noted   Unilateral primary osteoarthritis, right hip 09/05/2024   Unilateral primary osteoarthritis, left hip 09/05/2024   Hyperlipidemia, unspecified 09/08/2022   Pain in joint of right hip 07/23/2022   Chronic low back pain 07/23/2022   Body mass index (BMI) 50.0-59.9, adult (HCC) 04/02/2022   Hyperglycemia due to type 2 diabetes mellitus (HCC) 04/02/2022   Mixed anxiety and depressive disorder 04/02/2022   Diabetes mellitus (HCC) 01/08/2022   Irritable bowel syndrome 01/04/2021   Vitamin D  deficiency 01/09/2018   Class 3 severe obesity due to excess calories with serious comorbidity and body mass index (BMI) of 50.0 to 59.9 in adult (HCC) 01/09/2018   Medication intolerance 01/09/2018   Essential hypertension 10/08/2017   Type 2 diabetes mellitus with complication, with long-term current use of insulin  (HCC) 10/08/2017    PCP: Corlis Pagan NP  REFERRING PROVIDER: Vernetta Lonni GRADE, MD  REFERRING DIAG: M16.11 (ICD-10-CM) - Unilateral primary osteoarthritis, right hip M16.12 (ICD-10-CM) - Unilateral primary osteoarthritis, left  hip E66.01 (ICD-10-CM) - Severe obesity (BMI >= 40) (HCC)  Rationale for Evaluation and Treatment: Rehabilitation  THERAPY DIAG:  Pain in left hip  Pain in right hip  Muscle weakness (generalized)  Difficulty in walking, not elsewhere classified  ONSET DATE: March 2025  SUBJECTIVE:                                                                                                                                                                                           SUBJECTIVE STATEMENT: Tara Livingston reports continued HEP compliance.  She mentioned some difficulty with some of her exercises so we will review  this today.  She sees Dr. Vernetta 12/20/2023.    Pt indicated getting worse over time, had a fall in March while walking dog.  Pt indicated progressive trouble with standing/walking activity. Rt greater than Lt.  Pt indicated occasional trouble when rolling over to Rt side. Pt indicated difficulty with daily activity with exercise walking, walking dogs, work in standing, housework activity.   Pt indicated discussion was started about possible THA.   PERTINENT HISTORY:  DM, HTN, obesity  PAIN:  NPRS scale: Remains 0-5/10 with an occasional 10/10 stab Pain location: Lt and Rt hip.  Rt > Lt.    Pain description: Ache, can be Sharp, stabbing Aggravating factors: WB activity including walking, standing, cleaning Relieving factors: resting sitting, heating pad, TENS  PRECAUTIONS: None  WEIGHT BEARING RESTRICTIONS: No  FALLS:  Has patient fallen in last 6 months? No  LIVING ENVIRONMENT: Lives in: House/apartment Stairs: flight of stairs to bedroom.    OCCUPATION: Has standing, sitting desk.   PLOF: Independent, walking dogs, walking for exercise.   5 dogs.   PATIENT GOALS: Reduce pain, get back to activity.    OBJECTIVE:   PATIENT SURVEYS:  Patient-Specific Activity Scoring Scheme  0 represents unable to perform. 10 represents able to perform at prior level. 0 1  2 3 4 5 6 7 8 9 10  (Date and Score)   Activity Eval  11/08/2024    1. Standing prolonged (5 mins +)  4    2. Cleaning  4    3. Walking prolonged (5 mins +) 4   4. Walking dogs 4   5. Exercise (walking program) 4   Score 4 avg    Total score = sum of the activity scores/number of activities Minimum detectable change (90%CI) for average score = 2 points Minimum detectable change (90%CI) for single activity score = 3 points  SCREENING FOR RED FLAGS: 11/08/2024 Bowel or bladder incontinence: No Cauda equina syndrome: No  COGNITION: 11/08/2024 Overall cognitive status: WFL normal      SENSATION: 11/08/2024 No specific testing.   MUSCLE LENGTH: 11/08/2024 No specific testing.   POSTURE:  11/08/2024 No specific assessment in standing today for back/hip alignment.  Can check in future.   PALPATION: 11/08/2024 Trigger points with concordant symptoms in Rt glute med/min.  Mild tenderness Lt glute med/min.  Both hips tenderness over greater trochanter.   LOWER EXTREMITY ROM:      Right Eval 11/08/2024 Left Eval 11/08/2024  Hip flexion    Hip extension    Hip abduction    Hip adduction    Hip internal rotation 12 PROM in 90 deg hip flexion  15 PROM in 90 deg hip flexion   Hip external rotation 50 PROM in 90 deg hip flexion  70 PROM in 90 deg hip flexion   Knee flexion    Knee extension    Ankle dorsiflexion    Ankle plantarflexion    Ankle inversion    Ankle eversion     (Blank rows = not tested)  LOWER EXTREMITY MMT:    MMT Right Eval 11/08/2024 Left Eval 11/08/2024  Hip flexion 4/5 4/5  Hip extension    Hip abduction 3/5 4/5  Hip adduction    Hip internal rotation    Hip external rotation    Knee flexion 5/5 5/5  Knee extension 4/5 4/5  Ankle dorsiflexion 5/5 5/5  Ankle plantarflexion    Ankle inversion    Ankle eversion     (Blank rows = not  tested)   SPECIAL TESTS:  11/08/2024 None tested.   FUNCTIONAL TESTS:  .11/08/2024 Lt SLS: 7 seconds Rt SLS  < 3 seconds with pain  GAIT: 11/08/2024 Ambulation independent.  Trendelenburg noted on Rt more than Lt.                                                                                                                                                                                                                    TODAY'S TREATMENT:                                                                                                         DATE:  11/25/2024 Yoga Bridge 10 for 5 seconds Figure 4 stretch with push 5 x 20 seconds each side Figure 4 stretch with pull 5 x 20 seconds each side Side lie clams with Black Thera-Band 10 x 5 seconds bilateral  Functional Activities: Hip hike at counter top 2 sets of 10 for 3 seconds (needed corrective feedback, for lateral lean with gait)  Step up and back 10 times lead with left, down with right Step up and over 10 times lead with left, down with right   11/18/2024 Yoga Bridge 2 sets of 10 for 5 seconds Figure 4 stretch with push 5 x 20 seconds each side Figure 4 stretch with pull 5 x 20 seconds each side Side lie clams with Black Thera-Band 2 sets of 10 bilateral   Functional Activities: Hip hike at counter top 2 sets of 10 for 3 seconds (needed feedback, correction, for lateral lean with gait)   11/08/2024  Therex:    HEP instruction/performance c cues for techniques, handout provided.  Trial set performed of each for comprehension and symptom assessment.  See below for exercise list Encouraged use of towel or strap to help with stretching performed in HEP.  Discussed moving to tightness limit, not pain generation in stretching.   Manual Percussive device to Rt glute med/min trigger points.  Discussed possible purchase for use at home if desired.   PATIENT EDUCATION:  11/08/2024  Education details: HEP, POC Person educated: Patient Education method: Explanation, Demonstration, Verbal cues, and Handouts Education comprehension: verbalized understanding, returned demonstration, and verbal cues required  HOME EXERCISE PROGRAM: Access Code: [email or web address] URL: [email or web address] Date: 11/25/2024 Prepared by: Lamar Ivory  Exercises - Supine Bridge  - 1-2 x daily - 7 x weekly - 2-3 sets - 10-15 reps - 2 hold - Clamshell (Mirrored)  - 1-2 x daily - 7 x weekly - 2-3 sets - 10-15 reps - Supine Figure 4 pull towards  - 1-2 x daily - 7 x weekly - 1 sets - 3-5 reps - 15-30 hold - Supine Figure 4 Piriformis Stretch  - 1-2 x daily - 7 x weekly - 1 sets - 3-5 reps - 15-30 hold - Standing Hip Hiking  - 5 x daily - 7 x weekly - 1 sets - 10 reps - 3 seconds hold - Modified Thomas Stretch  - 1-2 x daily - 7 x weekly - 1 sets - 5 reps - 20 seconds hold  ASSESSMENT:  CLINICAL IMPRESSION: Tara Livingston continues to do a good job with her early exercises.  We spent time reviewing technique with some of her hip abductors strength program and we covered stairs as she had concerns about how to do this after surgery.  Tara Livingston will continue to benefit from the recommended plan of care.  Patient is a 48 y.o. who comes to clinic with complaints of bilateral hip pain with mobility, strength and movement coordination deficits that impair their ability to perform  usual daily and recreational functional activities without increase difficulty/symptoms at this time.  Patient to benefit from skilled PT services to address impairments and limitations to improve to previous level of function without restriction secondary to condition.   OBJECTIVE IMPAIRMENTS: Abnormal gait, decreased activity tolerance, decreased balance, decreased coordination, decreased endurance, decreased mobility, difficulty walking, decreased ROM, decreased strength, hypomobility, increased fascial restrictions, impaired perceived functional ability, increased muscle spasms, impaired flexibility, improper body mechanics, and pain.   ACTIVITY LIMITATIONS: carrying, lifting, bending, sitting, standing, squatting, sleeping, stairs, transfers, bed mobility, and locomotion level  PARTICIPATION LIMITATIONS: meal prep, cleaning, laundry, interpersonal relationship, driving, shopping, community activity, occupation, and dog care  PERSONAL FACTORS: Time since onset of injury/illness/exacerbation and DM, HTN, obesity are also affecting patient's functional outcome.   REHAB POTENTIAL: Good  CLINICAL DECISION MAKING: Stable/uncomplicated  EVALUATION COMPLEXITY: Low   GOALS: Goals reviewed with patient? Yes  SHORT TERM GOALS: (target date for Short term goals are 3 weeks 11/29/2024)  1. Patient will demonstrate independent use of home exercise program to maintain progress from in clinic treatments.  Goal status: Met 11/18/2024  LONG TERM GOALS: (target dates for all long term goals are 10 weeks  01/17/2025 )   1. Patient will demonstrate/report pain at worst less than or equal to 2/10 to facilitate minimal limitation in daily activity secondary to pain symptoms.  Goal status: New   2. Patient will demonstrate independent use of home exercise program to facilitate ability to maintain/progress functional gains from skilled physical therapy services.  Goal status: New   3. Patient will  demonstrate Patient specific functional scale avg > or = 8/10 to indicate reduced disability due to condition.   Goal status: New   4. Patient will demonstrate lumbar extension 100 % WFL s symptoms to facilitate upright standing, walking posture at PLOF s limitation.  Goal status: New   5.  Patient will demonstrate bilateral LE MMT 5/5  throughout to facilitate transfers, ambulation, stairs  at [facility]   Goal status: New   6.  Patient will demonstrate ascending/descending stairs reciprocally s UE assist for community integration.   Goal status: New   7.  Patient will demonstrate SLS testing bilateral > 10 seconds to facilitate stability in ambulation.  Goal Status: New  PLAN:  PT FREQUENCY: 1-2x/week  PT DURATION: 10 weeks  PLANNED INTERVENTIONS: Can include 02853- PT Re-evaluation, 97110-Therapeutic exercises, 97530- Therapeutic activity, [id number]- Neuromuscular re-education, 97535- Self Care, 97140- Manual therapy, [phone]- Gait training, [phone]- Orthotic Fit/training, [phone]- Canalith repositioning, [id number]- Aquatic Therapy, [phone]- Electrical stimulation (unattended), [id number] Physical performance testing, 97016- Vasopneumatic device, [id number]- Ultrasound, [id number]- Traction (mechanical), [id number]- Ionotophoresis 4mg /ml Dexamethasone,  20560 - Needle insertion w/o injection 1 or 2 muscles, 20561 - Needle insertion w/o injection 3 or more muscles.   Patient/Family education, Balance training, Stair training, Taping, Dry Needling, Joint mobilization, Joint manipulation, Spinal manipulation, Spinal mobilization, Scar mobilization, Vestibular training, Visual/preceptual remediation/compensation, DME instructions, Cryotherapy, and Moist heat.  All performed as medically necessary.  All included unless contraindicated  PLAN FOR NEXT SESSION: Emphasis on hip strength and stability in preparation for possible THA.  Percussive device to myofascial trigger points.   Myer LELON Ivory PT, MPT 11/25/2024  4:29 PM    "

## 2024-11-29 ENCOUNTER — Ambulatory Visit (INDEPENDENT_AMBULATORY_CARE_PROVIDER_SITE_OTHER): Admitting: Rehabilitative and Restorative Service Providers"

## 2024-11-29 ENCOUNTER — Encounter: Payer: Self-pay | Admitting: Rehabilitative and Restorative Service Providers"

## 2024-11-29 DIAGNOSIS — M25551 Pain in right hip: Secondary | ICD-10-CM | POA: Diagnosis not present

## 2024-11-29 DIAGNOSIS — M6281 Muscle weakness (generalized): Secondary | ICD-10-CM

## 2024-11-29 DIAGNOSIS — R262 Difficulty in walking, not elsewhere classified: Secondary | ICD-10-CM | POA: Diagnosis not present

## 2024-11-29 DIAGNOSIS — M25552 Pain in left hip: Secondary | ICD-10-CM | POA: Diagnosis not present

## 2024-11-29 NOTE — Therapy (Signed)
 OUTPATIENT PHYSICAL THERAPY TREATMENT     Patient Name: Tara Livingston MRN: [id number] DOB:[birth year], 48 y.o., female Today's Date: 11/25/2024   END OF SESSION:   PT End of Session - 11/25/24 1519       Visit Number 5     Number of Visits 20     Date for Recertification  01/17/25     Authorization Type [employer] 20% coinsurance     PT Start Time 1514    PT Stop Time 1555    PT Time Calculation (min) 41 min     Activity Tolerance Patient tolerated treatment well;No increased pain;Patient limited by pain     Behavior During Therapy [facility] for tasks assessed/performed                      Past Medical History:  Diagnosis Date   Diabetes mellitus without complication (HCC)     Hypertension      doesn't take medication             Past Surgical History:  Procedure Laterality Date   BREAST SURGERY       CESAREAN SECTION                Patient Active Problem List    Diagnosis Date Noted   Unilateral primary osteoarthritis, right hip 09/05/2024   Unilateral primary osteoarthritis, left hip 09/05/2024   Hyperlipidemia, unspecified 09/08/2022   Pain in joint of right hip 07/23/2022   Chronic low back pain 07/23/2022   Body mass index (BMI) 50.0-59.9, adult (HCC) 04/02/2022   Hyperglycemia due to type 2 diabetes mellitus (HCC) 04/02/2022   Mixed anxiety and depressive disorder 04/02/2022   Diabetes mellitus (HCC) 01/08/2022   Irritable bowel syndrome 01/04/2021   Vitamin D  deficiency 01/09/2018   Class 3 severe obesity due to excess calories with serious comorbidity and body mass index (BMI) of 50.0 to 59.9 in adult (HCC) 01/09/2018   Medication intolerance 01/09/2018   Essential hypertension 10/08/2017   Type 2 diabetes mellitus with complication, with long-term current use of insulin  (HCC) 10/08/2017      PCP: Corlis Pagan NP   REFERRING PROVIDER: Vernetta Lonni GRADE, MD   REFERRING DIAG: M16.11 (ICD-10-CM) - Unilateral primary osteoarthritis, right hip M16.12  (ICD-10-CM) - Unilateral primary osteoarthritis, left hip E66.01 (ICD-10-CM) - Severe obesity (BMI >= 40) (HCC)   Rationale for Evaluation and Treatment: Rehabilitation   THERAPY DIAG:  Pain in left hip   Pain in right hip   Muscle weakness (generalized)   Difficulty in walking, not elsewhere classified   ONSET DATE: March 2025   SUBJECTIVE:  SUBJECTIVE STATEMENT: Dominica continues to do a good job with her pre-surgical HEP compliance.  She mentioned some difficulty with socks and toe nails so we will address this today.  She sees Dr. Vernetta 12/20/2023.     Pt indicated getting worse over time, had a fall in March while walking dog.  Pt indicated progressive trouble with standing/walking activity. Rt greater than Lt.  Pt indicated occasional trouble when rolling over to Rt side. Pt indicated difficulty with daily activity with exercise walking, walking dogs, work in standing, housework activity.    Pt indicated discussion was started about possible THA.    PERTINENT HISTORY:  DM, HTN, obesity   PAIN:  NPRS scale: No change, 0-5/10 with an occasional 10/10 stab Pain location: Lt and Rt hip.  Rt > Lt.    Pain description: Ache, can be Sharp, stabbing Aggravating factors: WB activity including walking, standing, cleaning Relieving factors: resting sitting, heating pad, TENS   PRECAUTIONS: None   WEIGHT BEARING RESTRICTIONS: No   FALLS:  Has patient fallen in last 6 months? No   LIVING ENVIRONMENT: Lives in: House/apartment Stairs: flight of stairs to bedroom.      OCCUPATION: Has standing, sitting desk.    PLOF: Independent, walking dogs, walking for exercise.   5 dogs.    PATIENT GOALS: Reduce pain, get back to activity.      OBJECTIVE:    PATIENT SURVEYS:  Patient-Specific  Activity Scoring Scheme  0 represents unable to perform. 10 represents able to perform at prior level. 0 1 2 3 4 5 6 7 8 9  10 (Date and Score)     Activity Eval  11/08/2024    1. Standing prolonged (5 mins +)  4    2. Cleaning  4    3. Walking prolonged (5 mins +) 4    4. Walking dogs 4    5. Exercise (walking program) 4    Score 4 avg      Total score = sum of the activity scores/number of activities Minimum detectable change (90%CI) for average score = 2 points Minimum detectable change (90%CI) for single activity score = 3 points   SCREENING FOR RED FLAGS: 11/08/2024 Bowel or bladder incontinence: No Cauda equina syndrome: No   COGNITION: 11/08/2024 Overall cognitive status: WFL normal                                      SENSATION: 11/08/2024 No specific testing.    MUSCLE LENGTH: 11/08/2024 No specific testing.    POSTURE:  11/08/2024 No specific assessment in standing today for back/hip alignment.  Can check in future.    PALPATION: 11/08/2024 Trigger points with concordant symptoms in Rt glute med/min.  Mild tenderness Lt glute med/min.  Both hips tenderness over greater trochanter.    LOWER EXTREMITY ROM:        Right Eval 11/08/2024 Left Eval 11/08/2024  Hip flexion      Hip extension      Hip abduction      Hip adduction      Hip internal rotation 12 PROM in 90 deg hip flexion  15 PROM in 90 deg hip flexion   Hip external rotation 50 PROM in 90 deg hip flexion  70 PROM in 90 deg hip flexion   Knee flexion      Knee extension      Ankle dorsiflexion  Ankle plantarflexion      Ankle inversion      Ankle eversion       (Blank rows = not tested)   LOWER EXTREMITY MMT:     MMT Right Eval 11/08/2024 Left Eval 11/08/2024  Hip flexion 4/5 4/5  Hip extension      Hip abduction 3/5 4/5  Hip adduction      Hip internal rotation      Hip external rotation      Knee flexion 5/5 5/5  Knee extension 4/5 4/5  Ankle dorsiflexion 5/5 5/5  Ankle  plantarflexion      Ankle inversion      Ankle eversion       (Blank rows = not tested)    SPECIAL TESTS:  11/08/2024 None tested.    FUNCTIONAL TESTS:  .11/08/2024 Lt SLS: 7 seconds Rt SLS < 3 seconds with pain   GAIT: 11/08/2024 Ambulation independent.  Trendelenburg noted on Rt more than Lt.                                                                                                                                                                                                                      TODAY'S TREATMENT:                                                                                                         DATE:  11/29/2024 Yoga Bridge 10 for 5 seconds Figure 4 stretch with push 5 x 20 seconds each side Figure 4 stretch with pull 5 x 20 seconds each side Side lie clams with Black Thera-Band 10 x 5 seconds bilateral    Functional Activities: Hip hike at counter top 2 sets of 10 for 3 seconds (needed corrective feedback, for lateral lean with gait)  Step up and back 6 inch step 10 times lead with left, down with right Step up and over 6 inch step 10 times lead with left, down with right Single knee to chest strap with other leg straight (for socks and shoes) Lateral step  up and hike off 2 inch step 10 x each side   11/25/2024 Yoga Bridge 10 for 5 seconds Figure 4 stretch with push 5 x 20 seconds each side Figure 4 stretch with pull 5 x 20 seconds each side Side lie clams with Black Thera-Band 10 x 5 seconds bilateral   Functional Activities: Hip hike at counter top 2 sets of 10 for 3 seconds (needed corrective feedback, for lateral lean with gait)  Step up and back 10 times lead with left, down with right Step up and over 10 times lead with left, down with right     11/18/2024 Yoga Bridge 2 sets of 10 for 5 seconds Figure 4 stretch with push 5 x 20 seconds each side Figure 4 stretch with pull 5 x 20 seconds each side Side lie clams with Black  Thera-Band 2 sets of 10 bilateral     Functional Activities: Hip hike at counter top 2 sets of 10 for 3 seconds (needed feedback, correction, for lateral lean with gait)   PATIENT EDUCATION:  11/08/2024  Education details: HEP, POC Person educated: Patient Education method: Explanation, Demonstration, Verbal cues, and Handouts Education comprehension: verbalized understanding, returned demonstration, and verbal cues required   HOME EXERCISE PROGRAM: Access Code: [email or web address] URL: [email or web address] Date: 11/29/2024 Prepared by: Lamar Ivory  Exercises - Supine Bridge  - 1-2 x daily - 7 x weekly - 2-3 sets - 10-15 reps - 2 hold - Clamshell (Mirrored)  - 1-2 x daily - 7 x weekly - 2-3 sets - 10-15 reps - Supine Figure 4 pull towards  - 1-2 x daily - 7 x weekly - 1 sets - 3-5 reps - 15-30 hold - Supine Figure 4 Piriformis Stretch  - 1-2 x daily - 7 x weekly - 1 sets - 3-5 reps - 15-30 hold - Standing Hip Hiking  - 5 x daily - 7 x weekly - 1 sets - 10 reps - 3 seconds hold - Modified Thomas Stretch  - 1-2 x daily - 7 x weekly - 1 sets - 5 reps - 20 seconds hold - Single Knee to Chest Stretch  - 1-2 x daily - 7 x weekly - 1 sets - 5 reps - 15-30 seconds seconds hold   ASSESSMENT:   CLINICAL  IMPRESSION: Khamani continues her consistency with her early exercises.  We progressed hip flexion active range activities to help make managing socks and toe nails easier, particularly post-surgery.  Hip abductors strength remains a high priority along with continued activities to improve function and prepare for a likely THA.  Shadiyah will continue to benefit from the recommended plan of care.   Patient is a 48 y.o. who comes to clinic with complaints of bilateral hip pain with mobility, strength and movement coordination deficits that impair their ability to perform usual daily and recreational functional activities without increase difficulty/symptoms at this time.  Patient to benefit from skilled PT services to address impairments and limitations to improve to previous level of function without restriction secondary to condition.    OBJECTIVE IMPAIRMENTS: Abnormal gait, decreased activity tolerance, decreased balance, decreased coordination, decreased endurance, decreased mobility, difficulty walking, decreased ROM, decreased strength, hypomobility, increased fascial restrictions, impaired perceived functional ability, increased muscle spasms, impaired flexibility, improper body mechanics, and pain.    ACTIVITY LIMITATIONS: carrying, lifting, bending, sitting, standing, squatting, sleeping, stairs, transfers, bed mobility, and locomotion level   PARTICIPATION LIMITATIONS: meal prep, cleaning, laundry, interpersonal relationship, driving, shopping, community activity, occupation, and dog care   PERSONAL FACTORS: Time since onset of injury/illness/exacerbation and DM, HTN, obesity are also affecting patient's functional outcome.    REHAB POTENTIAL: Good   CLINICAL DECISION MAKING: Stable/uncomplicated   EVALUATION COMPLEXITY: Low     GOALS: Goals reviewed with patient? Yes   SHORT TERM GOALS: (target date for Short term goals are 3 weeks 11/29/2024)   1. Patient will demonstrate independent  use of home exercise program to maintain progress from in clinic treatments.   Goal status: Met 11/18/2024   LONG TERM GOALS: (target dates for all long term goals are 10 weeks  01/17/2025 )   1. Patient will demonstrate/report pain at worst less than or equal to 2/10 to facilitate minimal limitation in daily activity secondary to pain symptoms.   Goal status: New   2. Patient will demonstrate independent use of home exercise program to facilitate ability to maintain/progress functional gains from skilled physical therapy services.   Goal status: New   3. Patient will demonstrate Patient specific functional scale avg > or = 8/10 to indicate reduced disability  due to condition.    Goal status: New   4. Patient will demonstrate lumbar extension 100 % WFL s symptoms to facilitate upright standing, walking posture at PLOF s limitation.   Goal status: New   5.  Patient will demonstrate bilateral LE MMT 5/5  throughout to facilitate transfers, ambulation, stairs at PLOF.   Goal status: New   6.  Patient will demonstrate ascending/descending stairs reciprocally s UE assist for community integration.    Goal status: New   7.  Patient will demonstrate SLS testing bilateral > 10 seconds to facilitate stability in ambulation.  Goal Status: New   PLAN:   PT FREQUENCY: 1-2x/week   PT DURATION: 10 weeks   PLANNED INTERVENTIONS: Can include 02853- PT Re-evaluation, 97110-Therapeutic exercises, 97530- Therapeutic activity, [id number]- Neuromuscular re-education, 97535- Self Care, 97140- Manual therapy, [phone]- Gait training, [phone]- Orthotic Fit/training, [phone]- Canalith repositioning, [id number]- Aquatic Therapy, [phone]- Electrical stimulation (unattended), [id number] Physical performance testing, 97016- Vasopneumatic device, [id number]- Ultrasound, [id number]- Traction (mechanical), [id number]- Ionotophoresis 4mg /ml Dexamethasone,  20560 - Needle insertion w/o injection 1 or 2 muscles, 20561 - Needle insertion w/o injection 3  or more muscles.    Patient/Family education, Balance training, Stair training, Taping, Dry Needling, Joint mobilization, Joint manipulation, Spinal manipulation, Spinal mobilization, Scar mobilization, Vestibular training, Visual/preceptual remediation/compensation, DME instructions, Cryotherapy, and Moist heat.  All performed as medically necessary.  All included unless contraindicated   PLAN FOR NEXT SESSION: Emphasis on hip strength and stability in preparation for possible THA.  Percussive device to myofascial trigger points.  Select flexibility exercises to address functional concerns.     Myer LELON Ivory PT, MPT 11/25/2024  4:29 PM

## 2024-12-03 ENCOUNTER — Ambulatory Visit (HOSPITAL_COMMUNITY)

## 2024-12-03 ENCOUNTER — Ambulatory Visit (HOSPITAL_COMMUNITY)
Admission: EM | Admit: 2024-12-03 | Discharge: 2024-12-03 | Disposition: A | Discharge status: Home / Self Care | Attending: Family Medicine | Admitting: Family Medicine

## 2024-12-03 ENCOUNTER — Encounter (HOSPITAL_COMMUNITY): Payer: Self-pay

## 2024-12-03 DIAGNOSIS — M25551 Pain in right hip: Secondary | ICD-10-CM

## 2024-12-03 MED ORDER — KETOROLAC TROMETHAMINE 30 MG/ML IJ SOLN
30.0000 mg | Freq: Once | INTRAMUSCULAR | Status: AC
  Administered 2024-12-03: 30 mg via INTRAMUSCULAR

## 2024-12-03 MED ORDER — KETOROLAC TROMETHAMINE 10 MG PO TABS: 10.0000 mg | ORAL_TABLET | Freq: Four times a day (QID) | ORAL | 0 refills | Status: AC | PRN

## 2024-12-03 MED ORDER — KETOROLAC TROMETHAMINE 30 MG/ML IJ SOLN
INTRAMUSCULAR | Status: AC
  Filled 2024-12-03: qty 1

## 2024-12-03 NOTE — Discharge Instructions (Addendum)
 By my review there are no broken bones in your x-rays.  I can see your Dexcom  The radiologist will also read your x-ray, and if their interpretation differs significantly from mine, and the management of your condition would change, we will call you.  You have been given a shot of Toradol  30 mg today.  Ketorolac  10 mg tablets--take 1 tablet every 6 hours as needed for pain.  This is the same medicine that is in the shot we just gave you While you are taking the ketorolac  tablets, do not take the naproxen .  Follow-up with your primary care and your orthopedist

## 2024-12-03 NOTE — ED Triage Notes (Signed)
 Patient states she fell on christmas eve on the stairs. States she was already in physical therapy for the same hip. Patient having increased pain in the right hip.   Patient tried naproxen  and ice/heat with no relief.

## 2024-12-03 NOTE — ED Provider Notes (Signed)
 " [facility]    CSN: [id number] Arrival date & time: 12/03/24  9195      History   Chief Complaint Chief Complaint  Patient presents with   Hip Pain   Fall    HPI Tara Livingston is a 48 y.o. female.    Hip Pain  Fall   Here for pain in her right hip. On December 24 she fell down her stairs when she possibly missed a step.  She fell onto the corner of the last step onto her right hip.  She now has pain on the lateral right hip that radiates down toward her knee.  No numbness or tingling  She did have some prior hip pain that has been deemed due to arthritis.  She has been taking naproxen  500 mg plus Tylenol , without any relief.  No head injury.  She did hit her right forearm, but that is feeling okay.  Her right shoulder is a little sore but is mobile  She is allergic to amlodipine  and lisinopril and statins.  She has an IUD  Past Medical History:  Diagnosis Date   Diabetes mellitus without complication (HCC)    Hypertension    doesn't take medication    Patient Active Problem List   Diagnosis Date Noted   Unilateral primary osteoarthritis, right hip 09/05/2024   Unilateral primary osteoarthritis, left hip 09/05/2024   Hyperlipidemia, unspecified 09/08/2022   Pain in joint of right hip 07/23/2022   Chronic low back pain 07/23/2022   Body mass index (BMI) 50.0-59.9, adult (HCC) 04/02/2022   Hyperglycemia due to type 2 diabetes mellitus (HCC) 04/02/2022   Mixed anxiety and depressive disorder 04/02/2022   Diabetes mellitus (HCC) 01/08/2022   Irritable bowel syndrome 01/04/2021   Vitamin D  deficiency 01/09/2018   Class 3 severe obesity due to excess calories with serious comorbidity and body mass index (BMI) of 50.0 to 59.9 in adult [facility]) 01/09/2018   Medication intolerance 01/09/2018   Essential hypertension 10/08/2017   Type 2 diabetes mellitus with complication, with long-term current use of insulin  (HCC) 10/08/2017    Past Surgical  History:  Procedure Laterality Date   BREAST SURGERY     CESAREAN SECTION      OB History     Gravida  1   Para  1   Term  1   Preterm      AB      Living  1      SAB      IAB      Ectopic      Multiple      Live Births               Home Medications    Prior to Admission medications  Medication Sig Start Date End Date Taking? Authorizing Provider  aspirin  EC 81 MG tablet Take 1 tablet (81 mg total) by mouth every other day. 03/07/17  Yes Loreli Elyn SAILOR, MD  Continuous Blood Gluc Receiver (DEXCOM G6 RECEIVER) DEVI AS DIRECTED 1 EVERY YEAR 365 DAYS 07/11/21  Yes [provider]  Continuous Blood Gluc Sensor (DEXCOM G6 SENSOR) MISC SMARTSIG:1 Topical Every 10 Days 08/13/21  Yes [provider]  Continuous Blood Gluc Transmit (DEXCOM G6 TRANSMITTER) MISC USE EVERY 90 DAYS 07/11/21  Yes [provider]  Digital Therapy (HELLO HEART BLOOD PRESSURE) KIT  12/27/20  Yes [provider]  empagliflozin  (JARDIANCE ) 25 MG TABS tablet Take 25 mg by mouth daily. 01/09/18  Yes  Loreli Elyn SAILOR, MD  fluticasone (FLONASE) 50 MCG/ACT nasal spray Place 1 spray into both nostrils daily. 04/16/21  Yes [provider]  ketorolac  (TORADOL ) 10 MG tablet Take 1 tablet (10 mg total) by mouth every 6 (six) hours as needed (pain). 12/03/24  Yes Eurika Sandy K, MD  levocetirizine (XYZAL ALLERGY 24HR) 5 MG tablet 1 tablet in the evening   Yes [provider]  levonorgestrel (MIRENA, 52 MG,) 20 MCG/24HR IUD Mirena 20 mcg/24 hours (7 yrs) 52 mg intrauterine device  Take by intrauterine route.   Yes [provider]  MOUNJARO 5 MG/0.5ML Pen Inject 5 mg into the skin once a week. 12/02/24  Yes [provider]  [Paused] naproxen  (NAPROSYN ) 500 MG tablet Take 1 tablet (500 mg total) by mouth 2 (two) times daily between meals as needed. Wait to take this until: December 13, 2024 10/19/24  Yes Vernetta Lonni GRADE, MD   Olmesartan-amLODIPine -HCTZ 40-10-12.5 MG TABS Take 1 tablet by mouth daily. 11/11/24  Yes [provider]  omeprazole (PRILOSEC) 40 MG capsule Take 40 mg by mouth daily. 10/28/20  Yes [provider]  Vitamin D , Ergocalciferol , (DRISDOL ) 50000 units CAPS capsule Take 1 capsule (50,000 Units total) by mouth every 7 (seven) days. 01/09/18  Yes Loreli Elyn SAILOR, MD  acetaminophen  (TYLENOL ) 500 MG tablet Take 2 tablets (1,000 mg total) by mouth every 8 (eight) hours as needed for up to 30 doses for mild pain (pain score 1-3) or fever. 02/29/24   Joesph Shaver Scales, PA-C    Family History Family History  Problem Relation Age of Onset   Diabetes Mother    Hypertension Mother    Healthy Father     Social History Social History[1]   Allergies   Statins, Amlodipine , and Lisinopril   Review of Systems Review of Systems   Physical Exam Triage Vital Signs ED Triage Vitals  Encounter Vitals Group     BP 12/03/24 0836 137/72     Girls Systolic BP Percentile --      Girls Diastolic BP Percentile --      Boys Systolic BP Percentile --      Boys Diastolic BP Percentile --      Pulse Rate 12/03/24 0836 85     Resp 12/03/24 0836 18     Temp 12/03/24 0836 97.8 F (36.6 C)     Temp Source 12/03/24 0836 Oral     SpO2 12/03/24 0836 94 %     Weight --      Height 12/03/24 0835 5' 5.75 (1.67 m)     Head Circumference --      Peak Flow --      Pain Score 12/03/24 0831 10     Pain Loc --      Pain Education --      Exclude from Growth Chart --    No data found.  Updated Vital Signs BP 137/72 (BP Location: Left Wrist)   Pulse 85   Temp 97.8 F (36.6 C) (Oral)   Resp 18   Ht 5' 5.75 (1.67 m)   LMP  (LMP Unknown)   SpO2 94%   BMI 47.00 kg/m   Visual Acuity Right Eye Distance:   Left Eye Distance:   Bilateral Distance:    Right Eye Near:   Left Eye Near:    Bilateral Near:     Physical Exam Vitals reviewed.  Constitutional:      General: She is not in  acute distress.  Appearance: She is not diaphoretic.  Musculoskeletal:     Comments: There is some tenderness over the right lateral hip/pelvis.  She is able to ambulate with a cane today.  Skin:    Coloration: Skin is not pale.  Neurological:     General: No focal deficit present.     Mental Status: She is alert and oriented to person, place, and time.  Psychiatric:        Behavior: Behavior normal.      UC Treatments / Results  Labs (all labs ordered are listed, but only abnormal results are displayed) Labs Reviewed - No data to display  EKG   Radiology No results found.  Procedures Procedures (including critical care time)  Medications Ordered in UC Medications  ketorolac  (TORADOL ) 30 MG/ML injection 30 mg (has no administration in time range)    Initial Impression / Assessment and Plan / UC Course  I have reviewed the triage vital signs and the nursing notes.  Pertinent labs & imaging results that were available during my care of the patient were reviewed by me and considered in my medical decision making (see chart for details).     By my review the x-rays are negative for fracture.  She does have a Dexcom that is visible.  She does have arthritis.  She is advised of radiology overread.  Toradol  injection is given here for pain and Toradol  tablets are sent to the pharmacy.  She is instructed not to take her naproxen  while taking the ketorolac  tablets   Final Clinical Impressions(s) / UC Diagnoses   Final diagnoses:  Pain in joint of right hip     Discharge Instructions      By my review there are no broken bones in your x-rays.  I can see your Dexcom  The radiologist will also read your x-ray, and if their interpretation differs significantly from mine, and the management of your condition would change, we will call you.  You have been given a shot of Toradol  30 mg today.  Ketorolac  10 mg tablets--take 1 tablet every 6 hours as needed for pain.   This is the same medicine that is in the shot we just gave you While you are taking the ketorolac  tablets, do not take the naproxen .  Follow-up with your primary care and your orthopedist     ED Prescriptions     Medication Sig Dispense Auth. Provider   ketorolac  (TORADOL ) 10 MG tablet Take 1 tablet (10 mg total) by mouth every 6 (six) hours as needed (pain). 20 tablet Markeesha Char K, MD      PDMP not reviewed this encounter.     [1]  Social History Tobacco Use   Smoking status: Former   Smokeless tobacco: Never  Vaping Use   Vaping status: Never Used  Substance Use Topics   Alcohol use: Yes   Drug use: No     Vonna Sharlet POUR, MD 12/03/24 1004  "

## 2024-12-05 ENCOUNTER — Ambulatory Visit: Admitting: Podiatry

## 2024-12-05 ENCOUNTER — Ambulatory Visit: Admitting: Rehabilitative and Restorative Service Providers"

## 2024-12-05 ENCOUNTER — Encounter: Payer: Self-pay | Admitting: Rehabilitative and Restorative Service Providers"

## 2024-12-05 ENCOUNTER — Ambulatory Visit (HOSPITAL_COMMUNITY): Payer: Self-pay

## 2024-12-05 DIAGNOSIS — R262 Difficulty in walking, not elsewhere classified: Secondary | ICD-10-CM | POA: Diagnosis not present

## 2024-12-05 DIAGNOSIS — M25552 Pain in left hip: Secondary | ICD-10-CM

## 2024-12-05 DIAGNOSIS — M6281 Muscle weakness (generalized): Secondary | ICD-10-CM

## 2024-12-05 DIAGNOSIS — M25551 Pain in right hip: Secondary | ICD-10-CM

## 2024-12-05 NOTE — Therapy (Signed)
 OUTPATIENT PHYSICAL THERAPY TREATMENT     Patient Name: Tara Livingston MRN: [id number] DOB:1976/11/30, 48 y.o., female Today's Date: 11/25/2024   END OF SESSION:  PT End of Session - 12/05/24 1444     Visit Number 6    Number of Visits 20    Date for Recertification  01/17/25    Authorization Type [employer] 20% coinsurance    PT Start Time 1433    PT Stop Time 1512    PT Time Calculation (min) 39 min    Activity Tolerance Patient limited by pain    Behavior During Therapy [facility] for tasks assessed/performed                    Past Medical History:  Diagnosis Date   Diabetes mellitus without complication (HCC)     Hypertension      doesn't take medication             Past Surgical History:  Procedure Laterality Date   BREAST SURGERY       CESAREAN SECTION                Patient Active Problem List    Diagnosis Date Noted   Unilateral primary osteoarthritis, right hip 09/05/2024   Unilateral primary osteoarthritis, left hip 09/05/2024   Hyperlipidemia, unspecified 09/08/2022   Pain in joint of right hip 07/23/2022   Chronic low back pain 07/23/2022   Body mass index (BMI) 50.0-59.9, adult (HCC) 04/02/2022   Hyperglycemia due to type 2 diabetes mellitus (HCC) 04/02/2022   Mixed anxiety and depressive disorder 04/02/2022   Diabetes mellitus (HCC) 01/08/2022   Irritable bowel syndrome 01/04/2021   Vitamin D  deficiency 01/09/2018   Class 3 severe obesity due to excess calories with serious comorbidity and body mass index (BMI) of 50.0 to 59.9 in adult (HCC) 01/09/2018   Medication intolerance 01/09/2018   Essential hypertension 10/08/2017   Type 2 diabetes mellitus with complication, with long-term current use of insulin  (HCC) 10/08/2017      PCP: Corlis Pagan NP   REFERRING PROVIDER: Vernetta Lonni GRADE, MD   REFERRING DIAG: M16.11 (ICD-10-CM) - Unilateral primary osteoarthritis, right hip M16.12 (ICD-10-CM) - Unilateral primary osteoarthritis, left  hip E66.01 (ICD-10-CM) - Severe obesity (BMI >= 40) (HCC)   Rationale for Evaluation and Treatment: Rehabilitation   THERAPY DIAG:  Pain in left hip   Pain in right hip   Muscle weakness (generalized)   Difficulty in walking, not elsewhere classified   ONSET DATE: March 2025   SUBJECTIVE:  SUBJECTIVE STATEMENT: Pt indicated a fall into/on stairs on Christmas eve.  Reported seeing urgent care and no fracture.  Was given injection.  Reported still having complaints with moving around and WB.   Reported fall on Rt side.    PERTINENT HISTORY:  DM, HTN, obesity   PAIN:  NPRS scale: upon arrival 6/10.   Pain location: Lt and Rt hip.  Rt > Lt.    Pain description: Ache, can be Sharp, stabbing Aggravating factors: WB activity including walking, standing, cleaning Relieving factors: resting sitting, heating pad, TENS   PRECAUTIONS: None   WEIGHT BEARING RESTRICTIONS: No   FALLS:  Has patient fallen in last 6 months? No   LIVING ENVIRONMENT: Lives in: House/apartment Stairs: flight of stairs to bedroom.      OCCUPATION: Has standing, sitting desk.    PLOF: Independent, walking dogs, walking for exercise.   5 dogs.    PATIENT GOALS: Reduce pain, get back to activity.      OBJECTIVE:    PATIENT SURVEYS:  Patient-Specific Activity Scoring Scheme  0 represents unable to perform. 10 represents able to perform at prior level. 0 1 2 3 4 5 6 7 8 9  10 (Date and Score)     Activity Eval  11/08/2024    1. Standing prolonged (5 mins +)  4    2. Cleaning  4    3. Walking prolonged (5 mins +) 4    4. Walking dogs 4    5. Exercise (walking program) 4    Score 4 avg      Total score = sum of the activity scores/number of activities Minimum detectable change (90%CI) for average score =  2 points Minimum detectable change (90%CI) for single activity score = 3 points   SCREENING FOR RED FLAGS: 11/08/2024 Bowel or bladder incontinence: No Cauda equina syndrome: No   COGNITION: 11/08/2024 Overall cognitive status: WFL normal                                      SENSATION: 11/08/2024 No specific testing.    MUSCLE LENGTH: 11/08/2024 No specific testing.    POSTURE:  11/08/2024 No specific assessment in standing today for back/hip alignment.  Can check in future.    PALPATION: 11/08/2024 Trigger points with concordant symptoms in Rt glute med/min.  Mild tenderness Lt glute med/min.  Both hips tenderness over greater trochanter.    LOWER EXTREMITY ROM:        Right Eval 11/08/2024 Left Eval 11/08/2024  Hip flexion      Hip extension      Hip abduction      Hip adduction      Hip internal rotation 12 PROM in 90 deg hip flexion  15 PROM in 90 deg hip flexion   Hip external rotation 50 PROM in 90 deg hip flexion  70 PROM in 90 deg hip flexion   Knee flexion      Knee extension      Ankle dorsiflexion      Ankle plantarflexion      Ankle inversion      Ankle eversion       (Blank rows = not tested)   LOWER EXTREMITY MMT:     MMT Right Eval 11/08/2024 Left Eval 11/08/2024 Right 12/05/2024 Left 12/05/2024  Hip flexion 4/5 4/5 5/5 5/5  Hip extension        Hip  abduction 3/5 4/5    Hip adduction        Hip internal rotation        Hip external rotation        Knee flexion 5/5 5/5    Knee extension 4/5 4/5    Ankle dorsiflexion 5/5 5/5    Ankle plantarflexion        Ankle inversion        Ankle eversion         (Blank rows = not tested)    SPECIAL TESTS:  11/08/2024 None tested.    FUNCTIONAL TESTS:  .11/08/2024 Lt SLS: 7 seconds Rt SLS < 3 seconds with pain   GAIT: 11/08/2024 Ambulation independent.  Trendelenburg noted on Rt more than Lt.                                                                                                                                                                                                                       TODAY'S TREATMENT:                                                                                                         DATE:  12/05/2024 Therex: Nustep lvl 5 12 mins for ROM, UE/LE Supine blue band hip abduction clam shell 2 x 15 bilaterally Supine bridge with blue band hip abduction hold during, 2 x 10    Manual: Percussive device to Rt glute max/med/min trigger points.   TODAY'S TREATMENT:  DATE: 11/29/2024 Yoga Bridge 10 for 5 seconds Figure 4 stretch with push 5 x 20 seconds each side Figure 4 stretch with pull 5 x 20 seconds each side Side lie clams with Black Thera-Band 10 x 5 seconds bilateral    Functional Activities: Hip hike at counter top 2 sets of 10 for 3 seconds (needed corrective feedback, for lateral lean with gait)  Step up and back 6 inch step 10 times lead with left, down with right Step up and over 6 inch step 10 times lead with left, down with right Single knee to chest strap with other leg straight (for socks and shoes) Lateral step up and hike off 2 inch step 10 x each side   TODAY'S TREATMENT:                                                                                                         DATE:11/25/2024 Yoga Bridge 10 for 5 seconds Figure 4 stretch with push 5 x 20 seconds each side Figure 4 stretch with pull 5 x 20 seconds each side Side lie clams with Black Thera-Band 10 x 5 seconds bilateral   Functional Activities: Hip hike at counter top 2 sets of 10 for 3 seconds (needed corrective feedback, for lateral lean with gait)  Step up and back 10 times lead with left, down with right Step up and over 10 times lead with left, down with right     TODAY'S TREATMENT:                                                                                                          DATE:11/18/2024 Yoga Bridge 2 sets of 10 for 5 seconds Figure 4 stretch with push 5 x 20 seconds each side Figure 4 stretch with pull 5 x 20 seconds each side Side lie clams with Black Thera-Band 2 sets of 10 bilateral     Functional Activities: Hip hike at counter top 2 sets of 10 for 3 seconds (needed feedback, correction, for lateral lean with gait)   PATIENT EDUCATION:  11/08/2024  Education details: HEP, POC Person educated: Patient Education method: Explanation, Demonstration, Verbal cues, and Handouts Education comprehension: verbalized understanding, returned demonstration, and verbal cues required   HOME EXERCISE PROGRAM: Access Code: [email or web address] URL: [email or web address] Date: 11/29/2024 Prepared by: Lamar Ivory  Exercises - Supine Bridge  - 1-2 x daily - 7 x weekly - 2-3 sets - 10-15 reps - 2 hold - Clamshell (Mirrored)  - 1-2 x daily - 7 x weekly - 2-3 sets - 10-15 reps - Supine Figure 4 pull towards  - 1-2 x daily - 7 x weekly - 1 sets - 3-5 reps - 15-30 hold - Supine Figure 4 Piriformis Stretch  - 1-2 x daily - 7 x weekly - 1 sets - 3-5 reps - 15-30 hold - Standing Hip Hiking  - 5 x daily - 7 x weekly - 1 sets - 10 reps - 3 seconds hold -  Modified Thomas Stretch  - 1-2 x daily - 7 x weekly - 1 sets - 5 reps - 20 seconds hold - Single Knee to Chest Stretch  - 1-2 x daily - 7 x weekly - 1 sets - 5 reps - 15-30 seconds seconds hold   ASSESSMENT:   CLINICAL IMPRESSION: Adjustment of intervention due to recent fall and symptoms.  No worsening noted in activity within clinic.  Plan for graded return to activity as able.    OBJECTIVE IMPAIRMENTS: Abnormal gait, decreased activity tolerance, decreased balance, decreased coordination, decreased endurance, decreased mobility, difficulty walking, decreased ROM, decreased strength, hypomobility, increased fascial restrictions, impaired perceived functional ability, increased muscle spasms, impaired flexibility, improper body mechanics, and pain.    ACTIVITY LIMITATIONS: carrying, lifting, bending, sitting, standing, squatting, sleeping, stairs, transfers, bed mobility, and locomotion level   PARTICIPATION LIMITATIONS: meal prep, cleaning, laundry, interpersonal relationship, driving, shopping, community activity, occupation, and dog care   PERSONAL FACTORS: Time since onset of injury/illness/exacerbation and DM, HTN, obesity are also affecting patient's functional outcome.    REHAB POTENTIAL: Good   CLINICAL DECISION MAKING: Stable/uncomplicated   EVALUATION COMPLEXITY: Low     GOALS: Goals reviewed with patient? Yes   SHORT TERM GOALS: (target date for Short term goals are 3 weeks 11/29/2024)   1. Patient will demonstrate independent use of home exercise program to maintain progress from in clinic treatments.   Goal status: Met 11/18/2024   LONG TERM GOALS: (target dates for all long term goals are 10 weeks  01/17/2025 )   1. Patient will demonstrate/report pain at worst less than or equal to 2/10 to facilitate minimal limitation in daily activity secondary to pain symptoms.   Goal status: on going 12/05/2024   2. Patient will demonstrate independent use of home exercise  program to facilitate ability to maintain/progress functional gains from skilled physical therapy services.   Goal status: on going 12/05/2024   3. Patient will demonstrate Patient specific functional scale avg > or = 8/10 to indicate reduced disability due to condition.    Goal status: on going 12/05/2024   4. Patient will demonstrate lumbar extension 100 % WFL s symptoms to facilitate upright standing, walking posture at PLOF s limitation.   Goal status: on going 12/05/2024   5.  Patient will demonstrate bilateral LE MMT 5/5  throughout to facilitate transfers, ambulation, stairs at PLOF.   Goal status: on going 12/05/2024   6.  Patient will demonstrate ascending/descending stairs reciprocally s UE assist for community integration.    Goal status: on going 12/05/2024   7.  Patient  will demonstrate SLS testing bilateral > 10 seconds to facilitate stability in ambulation.  Goal Status: on going 12/05/2024   PLAN:   PT FREQUENCY: 1-2x/week   PT DURATION: 10 weeks   PLANNED INTERVENTIONS: Can include 02853- PT Re-evaluation, 97110-Therapeutic exercises, 97530- Therapeutic activity, 97112- Neuromuscular re-education, 97535- Self Care, 97140- Manual therapy, [phone]- Gait training, [phone]- Orthotic Fit/training, [phone]- Canalith repositioning, [id number]- Aquatic Therapy, [phone]- Electrical stimulation (unattended), [id number] Physical performance testing, 97016- Vasopneumatic device, [id number]- Ultrasound, [id number]- Traction (mechanical), [id number]- Ionotophoresis 4mg /ml Dexamethasone,  20560 - Needle insertion w/o injection 1 or 2 muscles, 20561 - Needle insertion w/o injection 3 or more muscles.    Patient/Family education, Balance training, Stair training, Taping, Dry Needling, Joint mobilization, Joint manipulation, Spinal manipulation, Spinal mobilization, Scar mobilization, Vestibular training, Visual/preceptual remediation/compensation, DME instructions, Cryotherapy, and Moist heat.  All performed as  medically necessary.  All included unless contraindicated   PLAN FOR NEXT SESSION: Return to mobility/strengthening as able.      Ozell Silvan, PT, DPT, OCS, ATC 12/05/2024  3:35 PM

## 2024-12-07 ENCOUNTER — Encounter: Payer: Self-pay | Admitting: Rehabilitative and Restorative Service Providers"

## 2024-12-07 ENCOUNTER — Ambulatory Visit: Admitting: Rehabilitative and Restorative Service Providers"

## 2024-12-07 DIAGNOSIS — M25552 Pain in left hip: Secondary | ICD-10-CM | POA: Diagnosis not present

## 2024-12-07 DIAGNOSIS — R262 Difficulty in walking, not elsewhere classified: Secondary | ICD-10-CM | POA: Diagnosis not present

## 2024-12-07 DIAGNOSIS — M6281 Muscle weakness (generalized): Secondary | ICD-10-CM

## 2024-12-07 DIAGNOSIS — M25551 Pain in right hip: Secondary | ICD-10-CM

## 2024-12-07 NOTE — Therapy (Signed)
 OUTPATIENT PHYSICAL THERAPY TREATMENT     Patient Name: Tara Livingston MRN: [id number] DOB:12/12/1975, 48 y.o., female Today's Date: 11/25/2024   END OF SESSION:  PT End of Session - 12/07/24 1515     Visit Number 7    Number of Visits 20    Date for Recertification  01/17/25    Authorization Type [employer] 20% coinsurance    PT Start Time 1515    PT Stop Time 1555    PT Time Calculation (min) 40 min    Activity Tolerance Patient limited by pain;Patient tolerated treatment well;No increased pain    Behavior During Therapy WFL for tasks assessed/performed                     Past Medical History:  Diagnosis Date   Diabetes mellitus without complication (HCC)     Hypertension      doesn't take medication             Past Surgical History:  Procedure Laterality Date   BREAST SURGERY       CESAREAN SECTION                Patient Active Problem List    Diagnosis Date Noted   Unilateral primary osteoarthritis, right hip 09/05/2024   Unilateral primary osteoarthritis, left hip 09/05/2024   Hyperlipidemia, unspecified 09/08/2022   Pain in joint of right hip 07/23/2022   Chronic low back pain 07/23/2022   Body mass index (BMI) 50.0-59.9, adult (HCC) 04/02/2022   Hyperglycemia due to type 2 diabetes mellitus (HCC) 04/02/2022   Mixed anxiety and depressive disorder 04/02/2022   Diabetes mellitus (HCC) 01/08/2022   Irritable bowel syndrome 01/04/2021   Vitamin D  deficiency 01/09/2018   Class 3 severe obesity due to excess calories with serious comorbidity and body mass index (BMI) of 50.0 to 59.9 in adult (HCC) 01/09/2018   Medication intolerance 01/09/2018   Essential hypertension 10/08/2017   Type 2 diabetes mellitus with complication, with long-term current use of insulin  (HCC) 10/08/2017      PCP: Corlis Pagan NP   REFERRING PROVIDER: Vernetta Lonni GRADE, MD   REFERRING DIAG: M16.11 (ICD-10-CM) - Unilateral primary osteoarthritis, right hip M16.12  (ICD-10-CM) - Unilateral primary osteoarthritis, left hip E66.01 (ICD-10-CM) - Severe obesity (BMI >= 40) (HCC)   Rationale for Evaluation and Treatment: Rehabilitation   THERAPY DIAG:  Pain in left hip   Pain in right hip   Muscle weakness (generalized)   Difficulty in walking, not elsewhere classified   ONSET DATE: March 2025   SUBJECTIVE:  SUBJECTIVE STATEMENT: Tara Livingston notes less pain over the past few days as time has elapsed from her fall down her stairs.   PERTINENT HISTORY:  DM, HTN, obesity   PAIN:  NPRS scale: As high as 7/10 this week.   Pain location: Lt and Rt hip.  Rt > Lt.    Pain description: Ache, can be Sharp, stabbing Aggravating factors: WB activity including walking, standing, cleaning Relieving factors: resting sitting, heating pad, TENS   PRECAUTIONS: None   WEIGHT BEARING RESTRICTIONS: No   FALLS:  Has patient fallen in last 6 months? No   LIVING ENVIRONMENT: Lives in: House/apartment Stairs: flight of stairs to bedroom.      OCCUPATION: Has standing, sitting desk.    PLOF: Independent, walking dogs, walking for exercise.   5 dogs.    PATIENT GOALS: Reduce pain, get back to activity.      OBJECTIVE:    PATIENT SURVEYS:  Patient-Specific Activity Scoring Scheme  0 represents unable to perform. 10 represents able to perform at prior level. 0 1 2 3 4 5 6 7 8 9  10 (Date and Score)     Activity Eval  11/08/2024    1. Standing prolonged (5 mins +)  4    2. Cleaning  4    3. Walking prolonged (5 mins +) 4    4. Walking dogs 4    5. Exercise (walking program) 4    Score 4 avg      Total score = sum of the activity scores/number of activities Minimum detectable change (90%CI) for average score = 2 points Minimum detectable change (90%CI) for  single activity score = 3 points   SCREENING FOR RED FLAGS: 11/08/2024 Bowel or bladder incontinence: No Cauda equina syndrome: No   COGNITION: 11/08/2024 Overall cognitive status: WFL normal                                      SENSATION: 11/08/2024 No specific testing.    MUSCLE LENGTH: 11/08/2024 No specific testing.    POSTURE:  11/08/2024 No specific assessment in standing today for back/hip alignment.  Can check in future.    PALPATION: 11/08/2024 Trigger points with concordant symptoms in Rt glute med/min.  Mild tenderness Lt glute med/min.  Both hips tenderness over greater trochanter.    LOWER EXTREMITY ROM:        Right Eval 11/08/2024 Left Eval 11/08/2024  Hip flexion      Hip extension      Hip abduction      Hip adduction      Hip internal rotation 12 PROM in 90 deg hip flexion  15 PROM in 90 deg hip flexion   Hip external rotation 50 PROM in 90 deg hip flexion  70 PROM in 90 deg hip flexion   Knee flexion      Knee extension      Ankle dorsiflexion      Ankle plantarflexion      Ankle inversion      Ankle eversion       (Blank rows = not tested)   LOWER EXTREMITY MMT:     MMT Right Eval 11/08/2024 Left Eval 11/08/2024 Right 12/05/2024 Left 12/05/2024  Hip flexion 4/5 4/5 5/5 5/5  Hip extension        Hip abduction 3/5 4/5    Hip adduction        Hip internal  rotation        Hip external rotation        Knee flexion 5/5 5/5    Knee extension 4/5 4/5    Ankle dorsiflexion 5/5 5/5    Ankle plantarflexion        Ankle inversion        Ankle eversion         (Blank rows = not tested)    SPECIAL TESTS:  11/08/2024 None tested.    FUNCTIONAL TESTS:  .11/08/2024 Lt SLS: 7 seconds Rt SLS < 3 seconds with pain   GAIT: 11/08/2024 Ambulation independent.  Trendelenburg noted on Rt more than Lt.                                                                                                                                                                                                                      TODAY'S TREATMENT:                                                                                                         DATE:  12/07/2024 NuStep Level 5 for 8 minutes (arms/legs) Yoga Bridge 10 for 5 seconds Figure 4 stretch with push 5 x 20 seconds each side Side lie clams with Black Thera-Band 10 x 5 seconds with 3 second hold, bilateral Quad sets 10 x 5 seconds bilateral  Functional Activities: Single knee to chest strap with other leg straight (for socks and shoes) 4 x 20 seconds each side Yoga bridge 10 x 5 seconds (bed mobility)   TODAY'S TREATMENT:  DATE:  12/05/2024 Therex: Nustep lvl 5 12 mins for ROM, UE/LE Supine blue band hip abduction clam shell 2 x 15 bilaterally Supine bridge with blue band hip abduction hold during, 2 x 10    Manual: Percussive device to Rt glute max/med/min trigger points.    TODAY'S TREATMENT:                                                                                                         DATE: 11/29/2024 Yoga Bridge 10 for 5 seconds Figure 4 stretch with push 5 x 20 seconds each side Figure 4 stretch with pull 5 x 20 seconds each side Side lie clams with Black Thera-Band 10 x 5 seconds bilateral    Functional Activities: Hip hike at counter top 2 sets of 10 for 3 seconds (needed corrective feedback, for lateral lean with gait)  Step up and back 6 inch step 10 times lead with left, down with right Step up and over 6 inch step 10 times lead with left, down with right Single knee to chest strap with other leg straight (for socks and shoes) Lateral step up and hike off 2 inch step 10 x each side   PATIENT EDUCATION:  11/08/2024  Education details: HEP, POC Person educated: Patient Education method: Explanation, Demonstration, Verbal cues, and Handouts Education comprehension: verbalized understanding, returned demonstration, and verbal cues required   HOME EXERCISE PROGRAM: Access Code: [email or web address] URL: [email or web address] Date: 11/29/2024 Prepared by: Lamar Ivory  Exercises - Supine Bridge  - 1-2 x daily - 7 x weekly - 2-3 sets - 10-15 reps - 2 hold - Clamshell (Mirrored)  - 1-2 x daily - 7 x weekly - 2-3 sets - 10-15 reps - Supine Figure 4 pull towards  - 1-2 x daily - 7 x weekly - 1 sets - 3-5 reps - 15-30 hold - Supine Figure 4 Piriformis Stretch  - 1-2 x daily - 7 x weekly - 1 sets - 3-5 reps - 15-30 hold - Standing Hip Hiking  - 5 x daily - 7 x weekly - 1 sets - 10 reps - 3 seconds hold - Modified Thomas Stretch  - 1-2 x daily - 7 x weekly - 1 sets - 5 reps - 20 seconds hold - Single Knee to Chest Stretch  - 1-2 x daily - 7 x weekly - 1 sets - 5 reps - 15-30 seconds seconds hold   ASSESSMENT:   CLINICAL IMPRESSION: Tara Livingston looks as if she is doing better after her fall about a week ago down her stairs.  She notes she is moving better, although she is not back to where she was before her fall.  She is very motivated to lose the weight necessary for her to undergo a total hip replacement as she feels she is very limited by her hip arthritis.  Although her  recent fall has set her back as far as her participation and ability to move around to burn calories for weight loss required for surgery, Tara Livingston remains motivated and compliant with her home and clinic program.   OBJECTIVE IMPAIRMENTS: Abnormal gait, decreased activity tolerance, decreased balance, decreased coordination, decreased endurance, decreased mobility, difficulty walking, decreased ROM, decreased strength, hypomobility, increased fascial restrictions, impaired perceived functional ability, increased muscle spasms, impaired flexibility, improper body mechanics, and pain.    ACTIVITY LIMITATIONS: carrying, lifting, bending, sitting, standing, squatting, sleeping, stairs, transfers, bed mobility, and locomotion level   PARTICIPATION LIMITATIONS: meal prep, cleaning, laundry, interpersonal relationship, driving, shopping, community activity, occupation, and dog care   PERSONAL FACTORS: Time since onset of injury/illness/exacerbation and DM, HTN, obesity are also affecting patient's functional outcome.    REHAB POTENTIAL: Good   CLINICAL DECISION MAKING: Stable/uncomplicated   EVALUATION COMPLEXITY: Low     GOALS: Goals reviewed with patient? Yes   SHORT TERM GOALS: (target date for Short term goals are 3 weeks 11/29/2024)   1. Patient will demonstrate independent use of home exercise program to maintain progress from in clinic treatments.   Goal status: Met 11/18/2024   LONG TERM GOALS: (target dates for all long term goals are 10 weeks  01/17/2025 )   1. Patient will demonstrate/report pain at worst less than or equal to 2/10 to facilitate minimal limitation in daily activity secondary to pain symptoms.   Goal status: on going 12/05/2024   2. Patient will demonstrate independent use of home exercise program to facilitate ability to maintain/progress functional gains from skilled physical therapy services.   Goal status: on going 12/05/2024   3. Patient will demonstrate  Patient specific functional scale avg > or = 8/10 to indicate reduced disability due to condition.    Goal status: on going 12/05/2024   4. Patient will demonstrate lumbar extension  100 % WFL s symptoms to facilitate upright standing, walking posture at PLOF s limitation.   Goal status: on going 12/05/2024   5.  Patient will demonstrate bilateral LE MMT 5/5  throughout to facilitate transfers, ambulation, stairs at PLOF.   Goal status: on going 12/05/2024   6.  Patient will demonstrate ascending/descending stairs reciprocally s UE assist for community integration.    Goal status: on going 12/05/2024   7.  Patient will demonstrate SLS testing bilateral > 10 seconds to facilitate stability in ambulation.  Goal Status: on going 12/05/2024   PLAN:   PT FREQUENCY: 1-2x/week   PT DURATION: 10 weeks   PLANNED INTERVENTIONS: Can include 02853- PT Re-evaluation, 97110-Therapeutic exercises, 97530- Therapeutic activity, 97112- Neuromuscular re-education, 97535- Self Care, 97140- Manual therapy, [phone]- Gait training, [phone]- Orthotic Fit/training, [phone]- Canalith repositioning, [id number]- Aquatic Therapy, [phone]- Electrical stimulation (unattended), [id number] Physical performance testing, 97016- Vasopneumatic device, [id number]- Ultrasound, [id number]- Traction (mechanical), [id number]- Ionotophoresis 4mg /ml Dexamethasone,  20560 - Needle insertion w/o injection 1 or 2 muscles, 20561 - Needle insertion w/o injection 3 or more muscles.    Patient/Family education, Balance training, Stair training, Taping, Dry Needling, Joint mobilization, Joint manipulation, Spinal manipulation, Spinal mobilization, Scar mobilization, Vestibular training, Visual/preceptual remediation/compensation, DME instructions, Cryotherapy, and Moist heat.  All performed as medically necessary.  All included unless contraindicated   PLAN FOR NEXT SESSION: Emphasis on strengthening to prepare for a likely THA in 2026.     Myer MICAEL Ivory, PT,  MPT 12/07/2024  5:13 PM

## 2024-12-10 ENCOUNTER — Encounter (HOSPITAL_BASED_OUTPATIENT_CLINIC_OR_DEPARTMENT_OTHER): Payer: Self-pay

## 2024-12-10 ENCOUNTER — Other Ambulatory Visit: Payer: Self-pay

## 2024-12-10 ENCOUNTER — Emergency Department (HOSPITAL_BASED_OUTPATIENT_CLINIC_OR_DEPARTMENT_OTHER)
Admission: EM | Admit: 2024-12-10 | Discharge: 2024-12-11 | Disposition: A | Source: Home / Self Care | Attending: Emergency Medicine | Admitting: Emergency Medicine

## 2024-12-10 DIAGNOSIS — Z7982 Long term (current) use of aspirin: Secondary | ICD-10-CM | POA: Diagnosis not present

## 2024-12-10 DIAGNOSIS — M25551 Pain in right hip: Secondary | ICD-10-CM | POA: Diagnosis present

## 2024-12-10 DIAGNOSIS — M5431 Sciatica, right side: Secondary | ICD-10-CM | POA: Diagnosis not present

## 2024-12-10 MED ORDER — HYDROCODONE-ACETAMINOPHEN 5-325 MG PO TABS: 1.0000 | ORAL_TABLET | ORAL | 0 refills | Status: AC | PRN

## 2024-12-10 MED ORDER — KETOROLAC TROMETHAMINE 60 MG/2ML IM SOLN
60.0000 mg | Freq: Once | INTRAMUSCULAR | Status: AC
  Administered 2024-12-11: 60 mg via INTRAMUSCULAR
  Filled 2024-12-10: qty 2

## 2024-12-10 MED ORDER — METHYLPREDNISOLONE 4 MG PO TBPK: ORAL_TABLET | ORAL | 0 refills | Status: AC

## 2024-12-10 MED ORDER — DEXAMETHASONE SOD PHOSPHATE PF 10 MG/ML IJ SOLN
10.0000 mg | Freq: Once | INTRAMUSCULAR | Status: AC
  Administered 2024-12-11: 10 mg via INTRAMUSCULAR

## 2024-12-10 NOTE — ED Provider Notes (Signed)
 " [facility] DEPARTMENT AT [facility] Provider Note   CSN: [id number] Arrival date & time: 12/10/24  2332     Patient presents with: Hip Pain   Tara Livingston is a 49 y.o. female.   Patient presents to the emergency department for evaluation of right hip and leg pain.  Patient reports that she has a history of bad hip, was told she needs a replacement.  She did fall a week ago.  Patient reports that she was seen at urgent care and had an x-ray that did not show any fracture.  Patient has had increasing pain over the last couple of days that worsens with movement of the hip, pain radiates down the leg.  No change in bowel or bladder function.  No change in strength or sensation of the lower extremities.       Prior to Admission medications  Medication Sig Start Date End Date Taking? Authorizing Provider  HYDROcodone -acetaminophen  (NORCO/VICODIN) 5-325 MG tablet Take 1 tablet by mouth every 4 (four) hours as needed for moderate pain (pain score 4-6). 12/10/24  Yes Autumne Kallio, Lonni PARAS, MD  methylPREDNISolone  (MEDROL  DOSEPAK) 4 MG TBPK tablet As directed 12/10/24  Yes Jianna Drabik, Lonni PARAS, MD  acetaminophen  (TYLENOL ) 500 MG tablet Take 2 tablets (1,000 mg total) by mouth every 8 (eight) hours as needed for up to 30 doses for mild pain (pain score 1-3) or fever. 02/29/24   Joesph Shaver Scales, PA-C  aspirin  EC 81 MG tablet Take 1 tablet (81 mg total) by mouth every other day. 03/07/17   Loreli Elyn SAILOR, MD  Continuous Blood Gluc Receiver (DEXCOM G6 RECEIVER) DEVI AS DIRECTED 1 EVERY YEAR 365 DAYS 07/11/21   [provider]  Continuous Blood Gluc Sensor (DEXCOM G6 SENSOR) MISC SMARTSIG:1 Topical Every 10 Days 08/13/21   [provider]  Continuous Blood Gluc Transmit (DEXCOM G6 TRANSMITTER) MISC USE EVERY 90 DAYS 07/11/21   [provider]  Digital Therapy (HELLO HEART BLOOD PRESSURE) KIT  12/27/20   [provider]  empagliflozin  (JARDIANCE ) 25 MG  TABS tablet Take 25 mg by mouth daily. 01/09/18   Loreli Elyn SAILOR, MD  fluticasone (FLONASE) 50 MCG/ACT nasal spray Place 1 spray into both nostrils daily. 04/16/21   [provider]  ketorolac  (TORADOL ) 10 MG tablet Take 1 tablet (10 mg total) by mouth every 6 (six) hours as needed (pain). 12/03/24   Banister, Pamela K, MD  levocetirizine (XYZAL ALLERGY 24HR) 5 MG tablet 1 tablet in the evening    [provider]  levonorgestrel (MIRENA, 52 MG,) 20 MCG/24HR IUD Mirena 20 mcg/24 hours (7 yrs) 52 mg intrauterine device  Take by intrauterine route.    [provider]  MOUNJARO 5 MG/0.5ML Pen Inject 5 mg into the skin once a week. 12/02/24   [provider]  [Paused] naproxen  (NAPROSYN ) 500 MG tablet Take 1 tablet (500 mg total) by mouth 2 (two) times daily between meals as needed. Wait to take this until: December 13, 2024 10/19/24   Vernetta Lonni GRADE, MD  Olmesartan-amLODIPine -HCTZ 40-10-12.5 MG TABS Take 1 tablet by mouth daily. 11/11/24   [provider]  omeprazole (PRILOSEC) 40 MG capsule Take 40 mg by mouth daily. 10/28/20   [provider]  Vitamin D , Ergocalciferol , (DRISDOL ) 50000 units CAPS capsule Take 1 capsule (50,000 Units total) by mouth every 7 (seven) days. 01/09/18   Loreli Elyn SAILOR, MD    Allergies: Statins, Amlodipine , and Lisinopril    Review  of Systems  Updated Vital Signs BP (!) 172/86   Pulse 81   Temp 98 F (36.7 C) (Oral)   Resp 16   LMP  (LMP Unknown)   SpO2 96%   Physical Exam Vitals and nursing note reviewed.  Constitutional:      General: She is not in acute distress.    Appearance: She is well-developed.  HENT:     Head: Normocephalic and atraumatic.     Mouth/Throat:     Mouth: Mucous membranes are moist.  Eyes:     General: Vision grossly intact. Gaze aligned appropriately.     Extraocular Movements: Extraocular movements intact.     Conjunctiva/sclera: Conjunctivae normal.  Cardiovascular:     Rate  and Rhythm: Normal rate and regular rhythm.     Pulses: Normal pulses.     Heart sounds: Normal heart sounds, S1 normal and S2 normal. No murmur heard.    No friction rub. No gallop.  Pulmonary:     Effort: Pulmonary effort is normal. No respiratory distress.     Breath sounds: Normal breath sounds.  Abdominal:     General: Bowel sounds are normal.     Palpations: Abdomen is soft.     Tenderness: There is no abdominal tenderness. There is no guarding or rebound.     Hernia: No hernia is present.  Musculoskeletal:        General: No swelling.     Cervical back: Full passive range of motion without pain, normal range of motion and neck supple. No spinous process tenderness or muscular tenderness. Normal range of motion.     Lumbar back: No bony tenderness. Positive right straight leg raise test.       Back:     Right lower leg: No edema.     Left lower leg: No edema.     Comments: No midline pain, increased pain and radiation down the leg with flexion at the right hip  Skin:    General: Skin is warm and dry.     Capillary Refill: Capillary refill takes less than 2 seconds.     Findings: No ecchymosis, erythema, rash or wound.  Neurological:     General: No focal deficit present.     Mental Status: She is alert and oriented to person, place, and time.     GCS: GCS eye subscore is 4. GCS verbal subscore is 5. GCS motor subscore is 6.     Cranial Nerves: Cranial nerves 2-12 are intact.     Sensory: Sensation is intact.     Motor: Motor function is intact.     Coordination: Coordination is intact.     Comments: Normal strength and sensation, 5 out of 5 bilateral lower extremities, no saddle anesthesia, no foot drop.  Normal sensation  Psychiatric:        Attention and Perception: Attention normal.        Mood and Affect: Mood normal.        Speech: Speech normal.        Behavior: Behavior normal.     (all labs ordered are listed, but only abnormal results are displayed) Labs  Reviewed - No data to display  EKG: None  Radiology: No results found.   Procedures   Medications Ordered in the ED  ketorolac  (TORADOL ) injection 60 mg (has no administration in time range)  dexamethasone  (DECADRON ) injection 10 mg (has no administration in time range)  Medical Decision Making Risk Prescription drug management.   Differential Diagnosis considered includes, but not limited to: Muscle strain; acute disc herniation; lumbar fracture; cauda equina syndrome; infection; malignancy   Patient presents to the ER with musculoskeletal back pain. Examination reveals back tenderness without any associated neurologic findings. Patient's strength, sensation and reflexes were normal. There is no evidence of saddle anesthesia. Patient does not have a foot drop. Patient has not experienced any change in bowel or bladder function. As such, patient did not require any imaging or further studies. Patient was treated with analgesia.     Final diagnoses:  Sciatica of right side    ED Discharge Orders          Ordered    HYDROcodone -acetaminophen  (NORCO/VICODIN) 5-325 MG tablet  Every 4 hours PRN        12/10/24 2355    methylPREDNISolone  (MEDROL  DOSEPAK) 4 MG TBPK tablet        12/10/24 2355               Haze Lonni PARAS, MD 12/10/24 2355  "

## 2024-12-10 NOTE — ED Triage Notes (Signed)
 Pt states that she fell on christmas eve onto her right hip and since has been having pain. Pain shoots down the leg.

## 2024-12-12 ENCOUNTER — Encounter: Payer: Self-pay | Admitting: Rehabilitative and Restorative Service Providers"

## 2024-12-12 ENCOUNTER — Ambulatory Visit (INDEPENDENT_AMBULATORY_CARE_PROVIDER_SITE_OTHER): Admitting: Rehabilitative and Restorative Service Providers"

## 2024-12-12 DIAGNOSIS — R262 Difficulty in walking, not elsewhere classified: Secondary | ICD-10-CM | POA: Diagnosis not present

## 2024-12-12 DIAGNOSIS — M6281 Muscle weakness (generalized): Secondary | ICD-10-CM | POA: Diagnosis not present

## 2024-12-12 DIAGNOSIS — M25551 Pain in right hip: Secondary | ICD-10-CM | POA: Diagnosis not present

## 2024-12-12 DIAGNOSIS — M25552 Pain in left hip: Secondary | ICD-10-CM

## 2024-12-12 NOTE — Therapy (Signed)
 " OUTPATIENT PHYSICAL THERAPY TREATMENT   Patient Name: Tara Livingston MRN: [id number] DOB:Mar 19, 1976, 49 y.o., female Today's Date: 12/12/2024  END OF SESSION:  PT End of Session - 12/12/24 1531     Visit Number 8    Number of Visits 20    Date for Recertification  01/17/25    Authorization Type [employer] 20% coinsurance    PT Start Time 1516    PT Stop Time 1555    PT Time Calculation (min) 39 min    Activity Tolerance Patient limited by pain    Behavior During Therapy [facility] for tasks assessed/performed             Past Medical History:  Diagnosis Date   Diabetes mellitus without complication (HCC)    Hypertension    doesn't take medication   Past Surgical History:  Procedure Laterality Date   BREAST SURGERY     CESAREAN SECTION     Patient Active Problem List   Diagnosis Date Noted   Unilateral primary osteoarthritis, right hip 09/05/2024   Unilateral primary osteoarthritis, left hip 09/05/2024   Hyperlipidemia, unspecified 09/08/2022   Pain in joint of right hip 07/23/2022   Chronic low back pain 07/23/2022   Body mass index (BMI) 50.0-59.9, adult (HCC) 04/02/2022   Hyperglycemia due to type 2 diabetes mellitus (HCC) 04/02/2022   Mixed anxiety and depressive disorder 04/02/2022   Diabetes mellitus (HCC) 01/08/2022   Irritable bowel syndrome 01/04/2021   Vitamin D  deficiency 01/09/2018   Class 3 severe obesity due to excess calories with serious comorbidity and body mass index (BMI) of 50.0 to 59.9 in adult (HCC) 01/09/2018   Medication intolerance 01/09/2018   Essential hypertension 10/08/2017   Type 2 diabetes mellitus with complication, with long-term current use of insulin  (HCC) 10/08/2017    PCP: Corlis Pagan NP   REFERRING PROVIDER: Vernetta Lonni GRADE, MD   REFERRING DIAG: M16.11 (ICD-10-CM) - Unilateral primary osteoarthritis, right hip M16.12 (ICD-10-CM) - Unilateral primary osteoarthritis, left hip E66.01 (ICD-10-CM) - Severe obesity (BMI >= 40)  (HCC)   Rationale for Evaluation and Treatment: Rehabilitation   THERAPY DIAG:  Pain in left hip   Pain in right hip   Muscle weakness (generalized)   Difficulty in walking, not elsewhere classified   ONSET DATE: March 2025   SUBJECTIVE:                                                                                                                                                                                            SUBJECTIVE STATEMENT: Pt was seen by ED on 12/10/2024. Pt indicated onset of symptoms from  posterior hip and down back of leg.  Reported pain primarily.  Reported severe symptoms that led to ED visit.  Injection did help some.    PERTINENT HISTORY:  DM, HTN, obesity   PAIN:  NPRS scale: 5/10 Pain location: Rt posterior  Pain description: Ache, can be Sharp, stabbing Aggravating factors: WB activity including walking, standing, cleaning Relieving factors: resting sitting, heating pad, TENS   PRECAUTIONS: None   WEIGHT BEARING RESTRICTIONS: No   FALLS:  Has patient fallen in last 6 months? No   LIVING ENVIRONMENT: Lives in: House/apartment Stairs: flight of stairs to bedroom.      OCCUPATION: Has standing, sitting desk.    PLOF: Independent, walking dogs, walking for exercise.   5 dogs.    PATIENT GOALS: Reduce pain, get back to activity.      OBJECTIVE:    PATIENT SURVEYS:  Patient-Specific Activity Scoring Scheme  0 represents unable to perform. 10 represents able to perform at prior level. 0 1 2 3 4 5 6 7 8 9  10 (Date and Score)     Activity Eval  11/08/2024    1. Standing prolonged (5 mins +)  4    2. Cleaning  4    3. Walking prolonged (5 mins +) 4    4. Walking dogs 4    5. Exercise (walking program) 4    Score 4 avg      Total score = sum of the activity scores/number of activities Minimum detectable change (90%CI) for average score = 2 points Minimum detectable change (90%CI) for single activity score = 3 points    SCREENING FOR RED FLAGS: 11/08/2024 Bowel or bladder incontinence: No Cauda equina syndrome: No   COGNITION: 11/08/2024 Overall cognitive status: WFL normal                                      SENSATION: 11/08/2024 No specific testing.    MUSCLE LENGTH: 11/08/2024 No specific testing.    POSTURE:  11/08/2024 No specific assessment in standing today for back/hip alignment.  Can check in future.    PALPATION: 11/08/2024 Trigger points with concordant symptoms in Rt glute med/min.  Mild tenderness Lt glute med/min.  Both hips tenderness over greater trochanter.    LUMBAR AROM 12/12/2024: Lumbar extension AROM:   50% WFL with end range symptoms, to Rt buttock.   LOWER EXTREMITY ROM:        Right Eval 11/08/2024 Left Eval 11/08/2024  Hip flexion      Hip extension      Hip abduction      Hip adduction      Hip internal rotation 12 PROM in 90 deg hip flexion  15 PROM in 90 deg hip flexion   Hip external rotation 50 PROM in 90 deg hip flexion  70 PROM in 90 deg hip flexion   Knee flexion      Knee extension      Ankle dorsiflexion      Ankle plantarflexion      Ankle inversion      Ankle eversion       (Blank rows = not tested)   LOWER EXTREMITY MMT:     MMT Right Eval 11/08/2024 Left Eval 11/08/2024 Right 12/05/2024 Left 12/05/2024  Hip flexion 4/5 4/5 5/5 5/5  Hip extension        Hip abduction 3/5 4/5    Hip adduction  Hip internal rotation        Hip external rotation        Knee flexion 5/5 5/5    Knee extension 4/5 4/5    Ankle dorsiflexion 5/5 5/5    Ankle plantarflexion        Ankle inversion        Ankle eversion         (Blank rows = not tested)    SPECIAL TESTS:  11/08/2024 None tested.    FUNCTIONAL TESTS:  .11/08/2024 Lt SLS: 7 seconds Rt SLS < 3 seconds with pain   GAIT: 11/08/2024 Ambulation independent.  Trendelenburg noted on Rt more than Lt.                                                                                                                                                                                                                      TODAY'S TREATMENT:                                                                                                         DATE:  12/12/2024 Therex: Nustep lvl 6 10.5 mins for ROM/mobility UE/LE Supine hookyling lumbar trunk rotation 15 sec x 3 bilateral Supine sciatic nerve flossing Rt leg (knee extension with ankle PF, knee flexion with ankle DF) x 10  Supine hooklying bridge 3 sec hold x 10 Supine hooklying figure 4 pull towards 30 sec x3  Standing lumbar extension AROM x 5    Manual Percussive device to Rt glute max, med/min, Rt QL lumbar.    TODAY'S TREATMENT:  DATE:  12/07/2024 NuStep Level 5 for 8 minutes (arms/legs) Yoga Bridge 10 for 5 seconds Figure 4 stretch with push 5 x 20 seconds each side Side lie clams with Black Thera-Band 10 x 5 seconds with 3 second hold, bilateral Quad sets 10 x 5 seconds bilateral  Functional Activities: Single knee to chest strap with other leg straight (for socks and shoes) 4 x 20 seconds each side Yoga bridge 10 x 5 seconds (bed mobility)   TODAY'S TREATMENT:                                                                                                         DATE:  12/05/2024 Therex: Nustep lvl 5 12 mins for ROM, UE/LE Supine blue band hip abduction clam shell 2 x 15 bilaterally Supine bridge with blue band hip abduction hold during, 2 x 10    Manual: Percussive device to Rt glute max/med/min trigger points.    TODAY'S TREATMENT:                                                                                                         DATE: 11/29/2024 Yoga Bridge 10 for 5 seconds Figure 4 stretch with push 5 x 20 seconds each side Figure 4 stretch with pull 5 x 20 seconds each side Side lie clams with Black  Thera-Band 10 x 5 seconds bilateral    Functional Activities: Hip hike at counter top 2 sets of 10 for 3 seconds (needed corrective feedback, for lateral lean with gait)  Step up and back 6 inch step 10 times lead with left, down with right Step up and over 6 inch step 10 times lead with left, down with right Single knee to chest strap with other leg straight (for socks and shoes) Lateral step up and hike off 2 inch step 10 x each side   PATIENT EDUCATION:  11/08/2024  Education details: HEP, POC Person educated: Patient Education method: Explanation, Demonstration, Verbal cues, and Handouts Education comprehension: verbalized understanding, returned demonstration, and verbal cues required   HOME EXERCISE PROGRAM: Access Code: [email or web address] URL: [email or web address] Date: 12/12/2024 Prepared by: Ozell Silvan  Exercises - Supine 90/90 Sciatic Nerve Glide with Knee Flexion/Extension  - 1-2 x daily - 7 x weekly - 1-2 sets - 10 reps - Standing Lumbar Extension  - 2-4 x daily - 7 x weekly - 1 sets - 5-10 reps - Supine Bridge  - 1-2 x daily - 7 x weekly - 2-3 sets - 10-15 reps - 2 hold - Clamshell (Mirrored)  - 1-2 x daily - 7 x weekly - 2-3 sets - 10-15 reps - Supine Figure 4 pull towards  -  1-2 x daily - 7 x weekly - 1 sets - 3-5 reps - 15-30 hold - Supine Figure 4 Piriformis Stretch  - 1-2 x daily - 7 x weekly - 1 sets - 3-5 reps - 15-30 hold - Standing Hip Hiking  - 5 x daily - 7 x weekly - 1 sets - 10 reps - 3 seconds hold - Modified Thomas Stretch  - 1-2 x daily - 7 x weekly - 1 sets - 5 reps - 20 seconds hold - Single Knee to Chest Stretch  - 1-2 x daily - 7 x weekly - 1 sets - 5 reps - 15-30 seconds seconds hold - Supine Quadricep Sets  - 2 x daily - 7 x weekly - 2-3 sets - 10 reps - 5 second hold   ASSESSMENT:   CLINICAL IMPRESSION: Complaints of posterior hip/leg symptoms noted and indicated.  Spent time today to assess lumbar connection to symptoms.  Tenderness was noted in hip musculature as well.  Additional HEP for back/nerve related mobility.  Continued skilled PT services may benefit Pt.   Some benefits noted in lumbar mobility intervention today.    OBJECTIVE IMPAIRMENTS: Abnormal gait, decreased activity tolerance, decreased balance, decreased coordination, decreased endurance, decreased mobility, difficulty walking, decreased ROM, decreased strength, hypomobility, increased fascial restrictions, impaired perceived functional ability, increased muscle spasms, impaired flexibility, improper body mechanics, and pain.    ACTIVITY LIMITATIONS: carrying, lifting, bending, sitting, standing, squatting, sleeping, stairs, transfers, bed mobility, and locomotion level   PARTICIPATION LIMITATIONS: meal prep, cleaning, laundry, interpersonal relationship, driving, shopping, community activity, occupation, and dog care   PERSONAL FACTORS: Time since onset of injury/illness/exacerbation and DM, HTN, obesity are also affecting patient's functional outcome.    REHAB POTENTIAL: Good   CLINICAL DECISION MAKING: Stable/uncomplicated   EVALUATION COMPLEXITY: Low     GOALS: Goals reviewed with patient? Yes   SHORT TERM GOALS: (target date for Short term goals are 3 weeks  11/29/2024)   1. Patient will demonstrate independent use of home exercise program to maintain progress from in clinic treatments.   Goal status: Met 11/18/2024   LONG TERM GOALS: (target dates for all long term goals are 10 weeks  01/17/2025 )   1. Patient will demonstrate/report pain at worst less than or equal to 2/10 to facilitate minimal limitation in daily activity secondary to pain symptoms.   Goal status: on going 12/05/2024   2. Patient will demonstrate independent use of home exercise program to facilitate ability to maintain/progress functional gains from skilled physical therapy services.   Goal status: on going 12/05/2024   3. Patient will demonstrate Patient specific functional scale avg > or = 8/10 to indicate reduced disability due to condition.  Goal status: on going 12/05/2024   4. Patient will demonstrate lumbar extension 100 % WFL s symptoms to facilitate upright standing, walking posture at PLOF s limitation.   Goal status: on going 12/05/2024   5.  Patient will demonstrate bilateral LE MMT 5/5  throughout to facilitate transfers, ambulation, stairs at PLOF.   Goal status: on going 12/05/2024   6.  Patient will demonstrate ascending/descending stairs reciprocally s UE assist for community integration.    Goal status: on going 12/05/2024   7.  Patient will demonstrate SLS testing bilateral > 10 seconds to facilitate stability in ambulation.  Goal Status: on going 12/05/2024   PLAN:   PT FREQUENCY: 1-2x/week   PT DURATION: 10 weeks   PLANNED INTERVENTIONS: Can include 02853- PT Re-evaluation, 97110-Therapeutic exercises, 97530- Therapeutic activity, 97112- Neuromuscular re-education, 97535- Self Care, 97140- Manual therapy, [phone]- Gait training, [phone]- Orthotic Fit/training, [phone]- Canalith repositioning, [id number]- Aquatic Therapy, [phone]- Electrical stimulation (unattended), [id number] Physical performance testing, 97016- Vasopneumatic device, [id number]- Ultrasound,  [id number]- Traction (mechanical), [id number]- Ionotophoresis 4mg /ml Dexamethasone ,  [id number] - Needle insertion w/o injection 1 or 2 muscles, 20561 - Needle insertion w/o injection 3 or more muscles.    Patient/Family education, Balance training, Stair training, Taping, Dry Needling, Joint mobilization, Joint manipulation, Spinal manipulation, Spinal mobilization, Scar mobilization, Vestibular training, Visual/preceptual remediation/compensation, DME instructions, Cryotherapy, and Moist heat.  All performed as medically necessary.  All included unless contraindicated   PLAN FOR NEXT SESSION: Check on lumbar mobility/hip impact on posterior leg symptoms.      Ozell Silvan, PT, DPT, OCS, ATC 12/12/2024  10:51 AM    "

## 2024-12-14 ENCOUNTER — Encounter: Payer: Self-pay | Admitting: Rehabilitative and Restorative Service Providers"

## 2024-12-14 ENCOUNTER — Ambulatory Visit: Admitting: Rehabilitative and Restorative Service Providers"

## 2024-12-14 DIAGNOSIS — M25552 Pain in left hip: Secondary | ICD-10-CM | POA: Diagnosis not present

## 2024-12-14 DIAGNOSIS — R262 Difficulty in walking, not elsewhere classified: Secondary | ICD-10-CM | POA: Diagnosis not present

## 2024-12-14 DIAGNOSIS — M6281 Muscle weakness (generalized): Secondary | ICD-10-CM | POA: Diagnosis not present

## 2024-12-14 DIAGNOSIS — M25551 Pain in right hip: Secondary | ICD-10-CM

## 2024-12-14 NOTE — Therapy (Signed)
 " OUTPATIENT PHYSICAL THERAPY TREATMENT/PROGRESS NOTE Progress Note Reporting Period 11/08/2024 to 12/14/2024  See note below for Objective Data and Assessment of Progress/Goals.      Patient Name: Tara Livingston MRN: [id number] DOB:07-27-1976, 49 y.o., female Today's Date: 12/14/2024  END OF SESSION:  PT End of Session - 12/14/24 1524     Visit Number 9    Number of Visits 20    Date for Recertification  01/17/25    Authorization Type [employer] 20% coinsurance    PT Start Time 1524    PT Stop Time 1604    PT Time Calculation (min) 40 min    Activity Tolerance Patient limited by pain    Behavior During Therapy [facility] for tasks assessed/performed              Past Medical History:  Diagnosis Date   Diabetes mellitus without complication (HCC)    Hypertension    doesn't take medication   Past Surgical History:  Procedure Laterality Date   BREAST SURGERY     CESAREAN SECTION     Patient Active Problem List   Diagnosis Date Noted   Unilateral primary osteoarthritis, right hip 09/05/2024   Unilateral primary osteoarthritis, left hip 09/05/2024   Hyperlipidemia, unspecified 09/08/2022   Pain in joint of right hip 07/23/2022   Chronic low back pain 07/23/2022   Body mass index (BMI) 50.0-59.9, adult (HCC) 04/02/2022   Hyperglycemia due to type 2 diabetes mellitus (HCC) 04/02/2022   Mixed anxiety and depressive disorder 04/02/2022   Diabetes mellitus (HCC) 01/08/2022   Irritable bowel syndrome 01/04/2021   Vitamin D  deficiency 01/09/2018   Class 3 severe obesity due to excess calories with serious comorbidity and body mass index (BMI) of 50.0 to 59.9 in adult (HCC) 01/09/2018   Medication intolerance 01/09/2018   Essential hypertension 10/08/2017   Type 2 diabetes mellitus with complication, with long-term current use of insulin  (HCC) 10/08/2017    PCP: Corlis Pagan NP   REFERRING PROVIDER: Vernetta Lonni GRADE, MD   REFERRING DIAG: M16.11 (ICD-10-CM) - Unilateral  primary osteoarthritis, right hip M16.12 (ICD-10-CM) - Unilateral primary osteoarthritis, left hip E66.01 (ICD-10-CM) - Severe obesity (BMI >= 40) (HCC)   Rationale for Evaluation and Treatment: Rehabilitation   THERAPY DIAG:  Pain in left hip   Pain in right hip   Muscle weakness (generalized)   Difficulty in walking, not elsewhere classified   ONSET DATE: March 2025   SUBJECTIVE:  SUBJECTIVE STATEMENT: Evarose took a Vicodin today due to right sided sciatica from the gluteals to the knee.  Sciatica has limited HEP participation the past few days.   PERTINENT HISTORY:  DM, HTN, obesity   PAIN:  NPRS scale: 3-7/10 for right gluteals and posterior thigh, 5-6/10 for bilateral groin/hips Pain location: See above Pain description: Ache in hips, can be Sharp, stabbing with hips or radicular symptoms Aggravating factors: WB activity including walking, standing, cleaning Relieving factors: Not much   PRECAUTIONS: None   WEIGHT BEARING RESTRICTIONS: No   FALLS:  Has patient fallen in last 6 months? No   LIVING ENVIRONMENT: Lives in: House/apartment Stairs: flight of stairs to bedroom.      OCCUPATION: Has standing, sitting desk.    PLOF: Independent, walking dogs, walking for exercise.   5 dogs.    PATIENT GOALS: Reduce pain, get back to activity.      OBJECTIVE:    PATIENT SURVEYS:  Patient-Specific Activity Scoring Scheme  0 represents unable to perform. 10 represents able to perform at prior level. 0 1 2 3 4 5 6 7 8 9  10 (Date and Score)     Activity Eval  11/08/2024  12/14/2024  1. Standing prolonged (5 mins +)  4 3   2. Cleaning  4  3  3. Walking prolonged (5 mins +) 4  3  4. Walking dogs 4  3  5. Exercise (walking program) 4  3  Score 4 avg 3     Total score = sum  of the activity scores/number of activities Minimum detectable change (90%CI) for average score = 2 points Minimum detectable change (90%CI) for single activity score = 3 points   SCREENING FOR RED FLAGS: 11/08/2024 Bowel or bladder incontinence: No Cauda equina syndrome: No   COGNITION: 11/08/2024 Overall cognitive status: WFL normal                                      SENSATION: 11/08/2024 No specific testing.    MUSCLE LENGTH: 11/08/2024 No specific testing.    POSTURE:  11/08/2024 No specific assessment in standing today for back/hip alignment.  Can check in future.    PALPATION: 11/08/2024 Trigger points with concordant symptoms in Rt glute med/min.  Mild tenderness Lt glute med/min.  Both hips tenderness over greater trochanter.    LUMBAR AROM 12/12/2024: Lumbar extension AROM:   50% WFL with end range symptoms, to Rt buttock.   LOWER EXTREMITY ROM:        Right Eval 11/08/2024 Left Eval 11/08/2024 Left/Right 12/14/2024  Hip flexion     70  Hip extension       Hip abduction       Hip adduction       Hip internal rotation 12 PROM in 90 deg hip flexion  15 PROM in 90 deg hip flexion  Need 90 degrees of hip flexion for an appropriate measurement, quite limited bilaterally  Hip external rotation 50 PROM in 90 deg hip flexion  70 PROM in 90 deg hip flexion  Need 90 degrees of hip flexion for an appropriate measurement, quite limited bilaterally  Knee flexion       Knee extension       Ankle dorsiflexion       Ankle plantarflexion       Ankle inversion       Ankle eversion  Hamstrings   30/30   (Blank rows = not tested)   LOWER EXTREMITY MMT:     MMT Right Eval 11/08/2024 Left Eval 11/08/2024 Right 12/05/2024 Left 12/05/2024  Hip flexion 4/5 4/5 5/5 5/5  Hip extension        Hip abduction 3/5 4/5    Hip adduction        Hip internal rotation        Hip external rotation        Knee flexion 5/5 5/5    Knee extension 4/5 4/5    Ankle dorsiflexion 5/5 5/5     Ankle plantarflexion        Ankle inversion        Ankle eversion         (Blank rows = not tested)    SPECIAL TESTS:  11/08/2024 None tested.    FUNCTIONAL TESTS:  .11/08/2024 Lt SLS: 7 seconds Rt SLS < 3 seconds with pain   GAIT: 11/08/2024 Ambulation independent.  Trendelenburg noted on Rt more than Lt.                                                                                                                                                                                                                    TODAY'S TREATMENT:                                                                                                         DATE:  12/14/2024 Lumbar extensions (hands low on gluteals) 20 x 3 seconds Yoga Bridge 2 sets of 10 for 5 seconds Supine hamstrings stretch 5 x 20 seconds each side with the other leg straight  [id number]: Reviewed spine/disc pressure in various postures chart; discussed spine anatomy and basic body mechanics education; reviewed the importance of avoiding flexed, slouched and rotated positions; reviewed posture and body mechanics basics (including bed mobility) and made home walking recommendations (with a walker to improve posture and reduce hip joint pain)   TODAY'S TREATMENT:  DATE:  12/12/2024 Therex: Nustep lvl 6 10.5 mins for ROM/mobility UE/LE Supine hookyling lumbar trunk rotation 15 sec x 3 bilateral Supine sciatic nerve flossing Rt leg (knee extension with ankle PF, knee flexion with ankle DF) x 10  Supine hooklying bridge 3 sec hold x 10 Supine hooklying figure 4 pull towards 30 sec x3  Standing lumbar extension AROM x 5    Manual Percussive device to Rt glute max, med/min, Rt QL lumbar.    TODAY'S TREATMENT:                                                                                                         DATE:  12/07/2024 NuStep Level  5 for 8 minutes (arms/legs) Yoga Bridge 10 for 5 seconds Figure 4 stretch with push 5 x 20 seconds each side Side lie clams with Black Thera-Band 10 x 5 seconds with 3 second hold, bilateral Quad sets 10 x 5 seconds bilateral  Functional Activities: Single knee to chest strap with other leg straight (for socks and shoes) 4 x 20 seconds each side Yoga bridge 10 x 5 seconds (bed mobility)   PATIENT EDUCATION:  11/08/2024  Education details: HEP, POC Person educated: Patient Education method: Explanation, Demonstration, Verbal cues, and Handouts Education comprehension: verbalized understanding, returned demonstration, and verbal cues required   HOME EXERCISE PROGRAM: Access Code: [email or web address] URL: [email or web address] Date: 12/14/2024 Prepared by: Lamar Ivory  Exercises - Clamshell (Mirrored)  - 1-2 x daily - 7 x weekly - 2-3 sets - 10-15 reps - Supine Figure 4 pull towards  - 1-2 x daily - 7 x weekly - 1 sets - 3-5 reps - 15-30 hold - Supine Figure 4 Piriformis Stretch  - 1-2 x daily - 7 x weekly - 1 sets - 3-5 reps - 15-30 hold - Standing Hip Hiking  - 5 x daily - 7 x weekly - 1 sets - 10 reps - 3 seconds hold - Modified Thomas Stretch  - 1-2 x daily - 7 x weekly - 1 sets - 5 reps - 20  seconds hold - Single Knee to Chest Stretch  - 1-2 x daily - 7 x weekly - 1 sets - 5 reps - 15-30 seconds seconds hold - Supine Quadricep Sets  - 2 x daily - 7 x weekly - 2-3 sets - 10 reps - 5 second hold - Standing Lumbar Extension at Wall - Forearms  - 5 x daily - 7 x weekly - 1 sets - 5 reps - 3 seconds hold - Supine Hamstring Stretch  - 2-3 x daily - 7 x weekly - 1 sets - 5 reps - 20 seconds hold - Yoga Bridge  - 2 x daily - 7 x weekly - 2 sets - 10 reps - 5 seconds hold   ASSESSMENT:   CLINICAL IMPRESSION: Tara Livingston has been very good with her HEP compliance.  She is very motivated to lose the weight needed for her THA.  A recent flare-up of sciatica sent her to the ER on Saturday 12/10/2024 and has limited her HEP participation since that time.  She is unlikely to meet her functional goals with PT only given her severe bilateral hip OA.  If she is a candidate for THA soon, I recommend she transfer into independent PT until surgery.  If she needs to lose additional weight, she will benefit from continued supervised PT to address sciatica (slowing progress) and to prepare her for a comfortable transition into independent PT pre-THA.   OBJECTIVE IMPAIRMENTS: Abnormal gait, decreased activity tolerance, decreased balance, decreased coordination, decreased endurance, decreased mobility, difficulty walking, decreased ROM, decreased strength, hypomobility, increased fascial restrictions, impaired perceived functional ability, increased muscle spasms, impaired flexibility, improper body mechanics, and pain.    ACTIVITY LIMITATIONS: carrying, lifting, bending, sitting, standing, squatting, sleeping, stairs, transfers, bed mobility, and locomotion level   PARTICIPATION LIMITATIONS: meal prep, cleaning, laundry, interpersonal relationship, driving, shopping, community activity, occupation, and dog care   PERSONAL FACTORS: Time since onset of injury/illness/exacerbation and DM, HTN, obesity are also  affecting patient's functional outcome.    REHAB POTENTIAL: Good   CLINICAL DECISION MAKING: Stable/uncomplicated   EVALUATION COMPLEXITY: Low     GOALS: Goals reviewed with patient? Yes   SHORT TERM GOALS: (target date for Short term goals are 3 weeks 11/29/2024)   1. Patient will demonstrate independent use of home exercise program to maintain progress from in clinic treatments.   Goal status: Met 11/18/2024   LONG TERM GOALS: (target dates for all long term goals are 10 weeks  01/17/2025 )   1. Patient will demonstrate/report pain at worst less than or equal to 2/10 to facilitate minimal limitation in  daily activity secondary to pain symptoms.   Goal status: on going 12/14/2024   2. Patient will demonstrate independent use of home exercise program to facilitate ability to maintain/progress functional gains from skilled physical therapy services.   Goal status: on going 12/14/2024   3. Patient will demonstrate Patient specific functional scale avg > or = 8/10 to indicate reduced disability due to condition.    Goal status: on going 12/14/2024   4. Patient will demonstrate lumbar extension 100 % WFL s symptoms to facilitate upright standing, walking posture at PLOF s limitation.   Goal status: on going 12/14/2024   5.  Patient will demonstrate bilateral LE MMT 5/5  throughout to facilitate transfers, ambulation, stairs at PLOF.   Goal status: on going 12/14/2024   6.  Patient will demonstrate ascending/descending stairs reciprocally s UE assist for community integration.    Goal status: on going 12/14/2024   7.  Patient will demonstrate SLS testing bilateral > 10 seconds to facilitate stability in ambulation.  Goal Status: on going 12/14/2024   PLAN:   PT FREQUENCY: If NOT ready for THA, 1-2x/week for another 4 weeks to address sciatica and prepare for surgery.  If READY for THA, discharge into independent rehabilitation pre-surgery.   PT DURATION: 0-4 weeks   PLANNED  INTERVENTIONS: Can include 02853- PT Re-evaluation, 97110-Therapeutic exercises, 97530- Therapeutic activity, [id number]- Neuromuscular re-education, 97535- Self Care, 97140- Manual therapy, [phone]- Gait training, [phone]- Orthotic Fit/training, [phone]- Canalith repositioning, [id number]- Aquatic Therapy, [id number]- Electrical stimulation (unattended), [id number] Physical performance testing, 97016- Vasopneumatic device, [id number]- Ultrasound, [id number]- Traction (mechanical), [id number]- Ionotophoresis 4mg /ml Dexamethasone ,  79439 - Needle insertion w/o injection 1 or 2 muscles, 20561 - Needle insertion w/o injection 3 or more muscles.    Patient/Family education, Balance training, Stair training, Taping, Dry Needling, Joint mobilization, Joint manipulation, Spinal manipulation, Spinal mobilization, Scar mobilization, Vestibular training, Visual/preceptual remediation/compensation, DME instructions, Cryotherapy, and Moist heat.  All performed as medically necessary.  All included unless contraindicated   PLAN FOR NEXT SESSION: Check on peripheral symptoms, continue hip strength and functional progressions.     Myer LELON Ivory PT, MPT 12/12/2024  10:51 AM    "

## 2024-12-19 ENCOUNTER — Encounter: Payer: Self-pay | Admitting: Orthopaedic Surgery

## 2024-12-19 ENCOUNTER — Ambulatory Visit: Admitting: Orthopaedic Surgery

## 2024-12-19 VITALS — Ht 65.75 in | Wt 284.0 lb

## 2024-12-19 DIAGNOSIS — M1611 Unilateral primary osteoarthritis, right hip: Secondary | ICD-10-CM

## 2024-12-19 DIAGNOSIS — M1612 Unilateral primary osteoarthritis, left hip: Secondary | ICD-10-CM | POA: Diagnosis not present

## 2024-12-19 NOTE — Progress Notes (Signed)
 The patient is a well-known to me.  She actually works for [employer].  She has debilitating considerable valgus of both her hips with the right much worse than the left.  She has been on weight loss journey.  She is on Mounjaro and is checking with her endocrinologist about increasing Mounjaro.  She had a hard mechanical fall last month and had to be on steroids due to a flareup of pain and sciatica.  That is because some weight gain.  She had lost more weight but has put some back on.  More last saw her BMI was well over 47 and close to 48.  Is down to 46 now.  When I have her lay in a supine position there is still significant soft tissue around her hip but I do believe we are getting close to being able to proceed safely with surgery from our standpoint unless there are insurance stipulations that were will not allow us  to proceed with surgery.  However, we still need to have her wait a little lower.  Will see her back in 3 months with a repeat weight and BMI calculation and then can go from there in terms of scheduling her for hopefully a right hip replacement.

## 2025-03-20 ENCOUNTER — Ambulatory Visit: Admitting: Orthopaedic Surgery

## 2025-03-22 ENCOUNTER — Ambulatory Visit: Admitting: Podiatry
